# Patient Record
Sex: Male | Born: 1970 | Race: White | Hispanic: No | Smoking: Current every day smoker
Health system: Southern US, Community
[De-identification: ages and names within clinical notes are randomized; demographics above are authoritative.]

## PROBLEM LIST (undated history)

## (undated) DIAGNOSIS — F1490 Cocaine use, unspecified, uncomplicated: Secondary | ICD-10-CM

## (undated) HISTORY — PX: LEG SURGERY: SHX1003

---

## 2002-12-29 ENCOUNTER — Emergency Department (HOSPITAL_COMMUNITY): Admission: EM | Admit: 2002-12-29 | Discharge: 2002-12-29 | Payer: Self-pay | Admitting: Emergency Medicine

## 2002-12-29 ENCOUNTER — Encounter: Payer: Self-pay | Admitting: Emergency Medicine

## 2006-09-04 ENCOUNTER — Emergency Department (HOSPITAL_COMMUNITY): Admission: EM | Admit: 2006-09-04 | Discharge: 2006-09-04 | Payer: Self-pay | Admitting: Emergency Medicine

## 2010-07-19 ENCOUNTER — Emergency Department (HOSPITAL_COMMUNITY)
Admission: EM | Admit: 2010-07-19 | Discharge: 2010-07-19 | Payer: Self-pay | Source: Home / Self Care | Admitting: Emergency Medicine

## 2013-03-15 ENCOUNTER — Emergency Department (HOSPITAL_COMMUNITY)
Admission: EM | Admit: 2013-03-15 | Discharge: 2013-03-16 | Disposition: A | Discharge status: Home / Self Care | Payer: Medicaid Other | Attending: Emergency Medicine | Admitting: Emergency Medicine

## 2013-03-15 ENCOUNTER — Encounter (HOSPITAL_COMMUNITY): Payer: Self-pay | Admitting: *Deleted

## 2013-03-15 DIAGNOSIS — F1994 Other psychoactive substance use, unspecified with psychoactive substance-induced mood disorder: Secondary | ICD-10-CM

## 2013-03-15 DIAGNOSIS — F17200 Nicotine dependence, unspecified, uncomplicated: Secondary | ICD-10-CM | POA: Insufficient documentation

## 2013-03-15 DIAGNOSIS — R61 Generalized hyperhidrosis: Secondary | ICD-10-CM | POA: Insufficient documentation

## 2013-03-15 DIAGNOSIS — R450 Nervousness: Secondary | ICD-10-CM | POA: Insufficient documentation

## 2013-03-15 DIAGNOSIS — F14129 Cocaine abuse with intoxication, unspecified: Secondary | ICD-10-CM

## 2013-03-15 DIAGNOSIS — R45851 Suicidal ideations: Secondary | ICD-10-CM

## 2013-03-15 DIAGNOSIS — G479 Sleep disorder, unspecified: Secondary | ICD-10-CM | POA: Insufficient documentation

## 2013-03-15 DIAGNOSIS — F1910 Other psychoactive substance abuse, uncomplicated: Secondary | ICD-10-CM

## 2013-03-15 DIAGNOSIS — F1410 Cocaine abuse, uncomplicated: Secondary | ICD-10-CM | POA: Insufficient documentation

## 2013-03-15 DIAGNOSIS — F411 Generalized anxiety disorder: Secondary | ICD-10-CM | POA: Insufficient documentation

## 2013-03-15 HISTORY — DX: Cocaine use, unspecified, uncomplicated: F14.90

## 2013-03-15 LAB — CBC
HCT: 39.8 % (ref 39.0–52.0)
Hemoglobin: 14.1 g/dL (ref 13.0–17.0)
MCH: 30.5 pg (ref 26.0–34.0)
MCHC: 35.4 g/dL (ref 30.0–36.0)
MCV: 86.1 fL (ref 78.0–100.0)
Platelets: 310 10*3/uL (ref 150–400)
RBC: 4.62 MIL/uL (ref 4.22–5.81)
RDW: 13 % (ref 11.5–15.5)
WBC: 5.7 10*3/uL (ref 4.0–10.5)

## 2013-03-15 LAB — URINALYSIS, ROUTINE W REFLEX MICROSCOPIC
Glucose, UA: NEGATIVE mg/dL
Hgb urine dipstick: NEGATIVE
Ketones, ur: 15 mg/dL — AB
Leukocytes, UA: NEGATIVE
Nitrite: NEGATIVE
Protein, ur: 30 mg/dL — AB
Specific Gravity, Urine: 1.039 — ABNORMAL HIGH (ref 1.005–1.030)
Urobilinogen, UA: 1 mg/dL (ref 0.0–1.0)
pH: 5.5 (ref 5.0–8.0)

## 2013-03-15 LAB — SALICYLATE LEVEL: Salicylate Lvl: <2 mg/dL — ABNORMAL LOW (ref 2.8–20.0)

## 2013-03-15 LAB — COMPREHENSIVE METABOLIC PANEL
ALT: 16 U/L (ref 0–53)
AST: 22 U/L (ref 0–37)
Albumin: 4.1 g/dL (ref 3.5–5.2)
Alkaline Phosphatase: 64 U/L (ref 39–117)
BUN: 10 mg/dL (ref 6–23)
CO2: 29 mEq/L (ref 19–32)
Calcium: 9.3 mg/dL (ref 8.4–10.5)
Chloride: 94 mEq/L — ABNORMAL LOW (ref 96–112)
Creatinine, Ser: 1.1 mg/dL (ref 0.50–1.35)
GFR calc Af Amer: >90 mL/min (ref >90)
GFR calc non Af Amer: 82 mL/min — ABNORMAL LOW (ref >90)
Glucose, Bld: 96 mg/dL (ref 70–99)
Potassium: 3.1 mEq/L — ABNORMAL LOW (ref 3.5–5.1)
Sodium: 134 mEq/L — ABNORMAL LOW (ref 135–145)
Total Bilirubin: 0.4 mg/dL (ref 0.3–1.2)
Total Protein: 7.4 g/dL (ref 6.0–8.3)

## 2013-03-15 LAB — URINE MICROSCOPIC-ADD ON

## 2013-03-15 LAB — ETHANOL: Alcohol, Ethyl (B): <11 mg/dL (ref 0–11)

## 2013-03-15 LAB — RAPID URINE DRUG SCREEN, HOSP PERFORMED
Amphetamines: NOT DETECTED
Barbiturates: NOT DETECTED
Benzodiazepines: NOT DETECTED
Cocaine: POSITIVE — AB
Opiates: NOT DETECTED
Tetrahydrocannabinol: NOT DETECTED

## 2013-03-15 LAB — ACETAMINOPHEN LEVEL: Acetaminophen (Tylenol), Serum: <15 ug/mL (ref 10–30)

## 2013-03-15 MED ORDER — ACETAMINOPHEN 325 MG PO TABS
650.0000 mg | ORAL_TABLET | ORAL | Status: DC | PRN
Start: 2013-03-15 — End: 2013-03-16
  Administered 2013-03-16: 650 mg via ORAL
  Filled 2013-03-15: qty 2

## 2013-03-15 MED ORDER — ONDANSETRON HCL 4 MG PO TABS: 4.0000 mg | ORAL_TABLET | Freq: Three times a day (TID) | ORAL | Status: DC | PRN

## 2013-03-15 MED ORDER — IBUPROFEN 600 MG PO TABS: 600.0000 mg | ORAL_TABLET | Freq: Three times a day (TID) | ORAL | Status: DC | PRN

## 2013-03-15 MED ORDER — NICOTINE 21 MG/24HR TD PT24: 21.0000 mg | MEDICATED_PATCH | Freq: Every day | TRANSDERMAL | Status: DC

## 2013-03-15 MED ORDER — SODIUM CHLORIDE 0.9 % IV BOLUS (SEPSIS): 500.0000 mL | Freq: Once | INTRAVENOUS | Status: DC

## 2013-03-15 MED ORDER — ZOLPIDEM TARTRATE 5 MG PO TABS: 5.0000 mg | ORAL_TABLET | Freq: Every evening | ORAL | Status: DC | PRN

## 2013-03-15 MED ORDER — POTASSIUM CHLORIDE CRYS ER 20 MEQ PO TBCR
40.0000 meq | EXTENDED_RELEASE_TABLET | Freq: Once | ORAL | Status: AC
  Administered 2013-03-15: 40 meq via ORAL
  Filled 2013-03-15: qty 2

## 2013-03-15 MED ORDER — LORAZEPAM 1 MG PO TABS
1.0000 mg | ORAL_TABLET | Freq: Three times a day (TID) | ORAL | Status: DC | PRN
  Administered 2013-03-16: 1 mg via ORAL
  Filled 2013-03-15: qty 1

## 2013-03-15 MED ORDER — SODIUM CHLORIDE 0.9 % IV SOLN: Freq: Once | INTRAVENOUS | Status: DC

## 2013-03-15 NOTE — ED Notes (Signed)
ZOX:[id number]<AV> Expected date:<BR> Expected time:<BR> Means of arrival:<BR> Comments:<BR> T3

## 2013-03-15 NOTE — ED Notes (Signed)
Pt has 3 personal belongings bags.

## 2013-03-15 NOTE — ED Provider Notes (Signed)
History     CSN: [id number]  Arrival date & time 03/15/13  1949   First MD Initiated Contact with Patient 03/15/13 2142      Chief Complaint  Patient presents with  . Medical Clearance    (Consider location/radiation/quality/duration/timing/severity/associated sxs/prior treatment) HPI  Patient is a 42 yo M PMHx significant for substance abuse presenting for SI and requesting detox. Pt states his last use was this evening and denies other RD use or ETOH use. Pt states he is having suicidal ideations, but denies a plan. He has attempted to take his own life in the past. He denies HI, auditory/visual hallucinations. Denies any physical complaints. Patient denies fevers, chills, nausea, vomiting, or diarrhea.    Past Medical History  Diagnosis Date  . Crack cocaine use     Past Surgical History  Procedure Laterality Date  . Leg surgery      History reviewed. No pertinent family history.  History  Substance Use Topics  . Smoking status: Current Every Day Smoker    Types: Cigarettes  . Smokeless tobacco: Not on file  . Alcohol Use: No      Review of Systems  Constitutional: Positive for diaphoresis. Negative for fever and chills.  HENT: Negative.   Eyes: Negative.   Respiratory: Negative.   Cardiovascular: Negative.   Gastrointestinal: Negative.   Genitourinary: Negative.   Musculoskeletal: Negative.   Skin: Negative.   Neurological: Negative.   Psychiatric/Behavioral: Positive for suicidal ideas. The patient is nervous/anxious.     Allergies  Review of patient's allergies indicates no known allergies.  Home Medications  No current outpatient prescriptions on file.  BP 128/79  Pulse 60  Temp(Src) 98.3 F (36.8 C) (Oral)  Resp 18  SpO2 98%  Physical Exam  Constitutional: He is oriented to person, place, and time. He appears well-developed and well-nourished.  HENT:  Head: Normocephalic and atraumatic.  Eyes: EOM are normal. Pupils are equal, round, and  reactive to light.  Neck: Neck supple.  Cardiovascular: Normal rate, regular rhythm and normal heart sounds.   Pulmonary/Chest: Effort normal and breath sounds normal.  Abdominal: Soft. Bowel sounds are normal. There is no tenderness.  Neurological: He is alert and oriented to person, place, and time.  Skin: Skin is warm and dry. He is not diaphoretic.  Psychiatric: His mood appears anxious. He expresses suicidal ideation. He expresses no homicidal ideation. He expresses no suicidal plans and no homicidal plans.    ED Course  Procedures (including critical care time)  Medications  LORazepam (ATIVAN) tablet 1 mg (not administered)  acetaminophen (TYLENOL) tablet 650 mg (not administered)  ibuprofen (ADVIL,MOTRIN) tablet 600 mg (not administered)  zolpidem (AMBIEN) tablet 5 mg (not administered)  nicotine (NICODERM CQ - dosed in mg/24 hours) patch 21 mg (not administered)  ondansetron (ZOFRAN) tablet 4 mg (not administered)  potassium chloride SA (K-DUR,KLOR-CON) CR tablet 40 mEq (40 mEq Oral Given 03/15/13 2337)   Tolerating PO intake.   Labs Reviewed  COMPREHENSIVE METABOLIC PANEL - Abnormal; Notable for the following:    Sodium 134 (*)    Potassium 3.1 (*)    Chloride 94 (*)    GFR calc non Af Amer 82 (*)    All other components within normal limits  SALICYLATE LEVEL - Abnormal; Notable for the following:    Salicylate Lvl <2.0 (*)    All other components within normal limits  URINE RAPID DRUG SCREEN (HOSP PERFORMED) - Abnormal; Notable for the following:    Cocaine POSITIVE (*)  All other components within normal limits  URINALYSIS, ROUTINE W REFLEX MICROSCOPIC - Abnormal; Notable for the following:    Color, Urine ORANGE (*)    APPearance TURBID (*)    Specific Gravity, Urine 1.039 (*)    Bilirubin Urine MODERATE (*)    Ketones, ur 15 (*)    Protein, ur 30 (*)    All other components within normal limits  URINE MICROSCOPIC-ADD ON - Abnormal; Notable for the following:     Squamous Epithelial / LPF MANY (*)    All other components within normal limits  ACETAMINOPHEN LEVEL  CBC  ETHANOL   No results found.   1. Substance abuse   2. Suicidal ideation       MDM  Pt presents to the ED for medical clearance.  Pt is currently having SI ideations. Pt does not have a plan. The patients demeanor is dysphoric. Admits difficulty sleeping, loss of interests, feelings of worthlessness, lack of energy, difficulty concentrating, loss of appetite, feelings of anxiety. The patient currently does not have any acute physical complaints and is in no acute distress. The patients demeanor is anxious. The patient was brought to ED by self an is seeking assistance for crack cocaine detox, advised we do not have facilities for detox, but he needed to be cleared by Telepsych and Act team for SI. Denies HI, auditory or visual hallucinations. Given PO potassium supplementation for hypokalemia. Tolerating PO intake well. ACT consult was appreciated and pt was moved to Acute ED for further evaluation, as Psych ED full. Patient awaiting consults at time of shift change. Stable at time of shift change.             Jeannetta Ellis, PA-C 03/16/13 1604

## 2013-03-15 NOTE — ED Notes (Signed)
Pt requesting detox from crack. Reports last use was today. Also reports SI. Denies plan.

## 2013-03-16 DIAGNOSIS — F1410 Cocaine abuse, uncomplicated: Secondary | ICD-10-CM

## 2013-03-16 DIAGNOSIS — F14129 Cocaine abuse with intoxication, unspecified: Secondary | ICD-10-CM

## 2013-03-16 DIAGNOSIS — F1994 Other psychoactive substance use, unspecified with psychoactive substance-induced mood disorder: Secondary | ICD-10-CM | POA: Diagnosis present

## 2013-03-16 NOTE — BH Assessment (Signed)
Assessment Note   Clayton Howell is an 42 y.o. male requesting crack cocaine detox. Patient reports using $100 worth of cocaine each day for the past month. Sts that he has been binging. He last used 03/15/13 approximately $100 worth of cocaine. Patient also drinking a small amount of alcohol (1) 40oz beer each day. Patient's last drink was 03/15/2013 and he drank 1 swallow of beer.   Per ED notes, patient reported suicidal ideations with no plan upon his arrival to the ED. Writer assessed patient and he declined suicidal ideations. He admits to yrs of fleeing suicidal thoughts on/off. Patient denies depression and anxiety. Sts, "I just have problems sleeping". He however reported to ED staff upon arrival that he was having increased depression with multiple related symptoms. Patient denies having any stressors during the assessment. However, patient did state that he is homeless and doesn't have a support system.   Patient denies HI. He has no history.   Patient denies AVH's.   Patient has not received any mental health treatment in the past. He has however received treatment for substance abuse at [facility] 2005-2007 and 2x's in 2013. Patient has also received treatment at [facility] in 2013 for 13 days.  Axis I: Crack Cocaine Abuse; Depressive Disorder NOS Axis II: Deferred Axis III:  Past Medical History  Diagnosis Date  . Crack cocaine use    Axis IV: economic problems, housing problems, other psychosocial or environmental problems, problems related to social environment, problems with access to health care services and problems with primary support group Axis V: 41-50 serious symptoms  Past Medical History:  Past Medical History  Diagnosis Date  . Crack cocaine use     Past Surgical History  Procedure Laterality Date  . Leg surgery      Family History: History reviewed. No pertinent family history.  Social History:  reports that he has been smoking Cigarettes.  He has been  smoking about 0.00 packs per day. He does not have any smokeless tobacco history on file. He reports that he uses illicit drugs (Cocaine). He reports that he does not drink alcohol.  Additional Social History:  Alcohol / Drug Use Pain Medications: See MAR Prescriptions: See MAR Over the Counter: See MAR History of alcohol / drug use?: Yes Substance #1 Name of Substance 1: Alcohol  1 - Age of First Use: 42 yrs old  1 - Amount (size/oz): (1) 40oz beer every 3-4 days  1 - Frequency: every 3-4 days 1 - Duration: past month 1 - Last Use / Amount: 03/15/2013; "1 swallow" Substance #2 Name of Substance 2: Crack cocaine 2 - Age of First Use: 42 yrs old  2 - Amount (size/oz): $100 worth of cocaine 2 - Frequency: daily  2 - Duration: on-going/daiily for the past month 2 - Last Use / Amount: 03/15/13;$100 worth of cocaine  CIWA: CIWA-Ar BP: 112/69 mmHg Pulse Rate: 54 COWS:    Allergies: No Known Allergies  Home Medications:  (Not in a hospital admission)  OB/GYN Status:  No LMP for male patient.  General Assessment Data Location of Assessment: [facility] Living Arrangements: Other (Comment) (homeless) Can pt return to current living arrangement?: No Admission Status: Voluntary Is patient capable of signing voluntary admission?: Yes Transfer from: Acute Hospital Referral Source: Self/Family/Friend     Risk to self Suicidal Ideation: No Suicidal Intent: No Is patient at risk for suicide?: No Suicidal Plan?: No Access to Means: No What has been your use of drugs/alcohol  within the last 12 months?:  (patient reports alcohol and crack cocaine use) Previous Attempts/Gestures: No How many times?:  (0) Other Self Harm Risks: n/a Triggers for Past Attempts:  (no previous attempts/gestures) Intentional Self Injurious Behavior: None Family Suicide History: No Recent stressful life event(s): Other (Comment) (patient denies all stressors) Persecutory voices/beliefs?: No Depression:  Yes Depression Symptoms: Insomnia;Isolating;Loss of interest in usual pleasures;Feeling worthless/self pity;Feeling angry/irritable Substance abuse history and/or treatment for substance abuse?: No Suicide prevention information given to non-admitted patients: Not applicable  Risk to Others Homicidal Ideation: No Thoughts of Harm to Others: No Current Homicidal Intent: No Current Homicidal Plan: No Access to Homicidal Means: No Identified Victim:  (n/a) History of harm to others?: No Assessment of Violence: None Noted Violent Behavior Description:  (patient is calm and cooperative) Does patient have access to weapons?: No Criminal Charges Pending?: No Does patient have a court date: No  Psychosis Hallucinations: None noted Delusions: None noted  Mental Status Report Appear/Hygiene: Disheveled Eye Contact: Fair Motor Activity: Freedom of movement Speech: Pressured Level of Consciousness: Alert;Restless;Irritable Mood: Depressed;Anxious;Irritable;Preoccupied Affect: Appropriate to circumstance Anxiety Level: Minimal Thought Processes: Circumstantial Judgement: Impaired Orientation: Person;Place;Time;Situation Obsessive Compulsive Thoughts/Behaviors: None  Cognitive Functioning Concentration: Decreased Memory: Recent Intact;Remote Intact IQ: Average Insight: Fair Impulse Control: Fair Appetite: Good Weight Loss:  (none reported) Weight Gain:  (none reported) Sleep: Decreased Total Hours of Sleep:  (patient sleeping no more than 2-3 hours per day) Vegetative Symptoms: None  ADLScreening [facility] Assessment Services) Patient's cognitive ability adequate to safely complete daily activities?: Yes Patient able to express need for assistance with ADLs?: Yes Independently performs ADLs?: Yes (appropriate for developmental age)  Abuse/Neglect [facility]) Physical Abuse: Denies Verbal Abuse: Denies Sexual Abuse: Denies  Prior Inpatient Therapy Prior Inpatient Therapy:  Yes Prior Therapy Dates:  ([facility]-2007 & 2013(2x's),[facility]- 2013 (13 days),) Prior Therapy Facilty/Provider(s):  ([facility] & [facility]) Reason for Treatment:  (substance abuse treatment only)  Prior Outpatient Therapy Prior Outpatient Therapy: No Prior Therapy Dates:  (n/a) Prior Therapy Facilty/Provider(s):  (n/a) Reason for Treatment:  (n/a)  ADL Screening (condition at time of admission) Patient's cognitive ability adequate to safely complete daily activities?: Yes Patient able to express need for assistance with ADLs?: Yes Independently performs ADLs?: Yes (appropriate for developmental age) Weakness of Legs: None Weakness of Arms/Hands: None  Home Assistive Devices/Equipment Home Assistive Devices/Equipment: None    Abuse/Neglect Assessment (Assessment to be complete while patient is alone) Physical Abuse: Denies Verbal Abuse: Denies Sexual Abuse: Denies Exploitation of patient/patient's resources: Denies Self-Neglect: Denies Values / Beliefs Cultural Requests During Hospitalization: None Spiritual Requests During Hospitalization: None   Advance Directives (For Healthcare) Advance Directive: Patient does not have advance directive Nutrition Screen- [facility] Adult/[facility] Patient's home diet: Regular  Additional Information 1:1 In Past 12 Months?: No CIRT Risk: No Elopement Risk: No Does patient have medical clearance?: Yes     Disposition:  Disposition Initial Assessment Completed for this Encounter: Yes Disposition of Patient: Inpatient treatment program Type of inpatient treatment program: Adult  On Site Evaluation by:   Reviewed with Physician:     Melynda Ripple [facility] 03/16/2013 11:08 AM

## 2013-03-16 NOTE — BH Assessment (Signed)
BHH Assessment Progress Note  Patient assessed by ACT. Disposition pending consult with psychiatrist Dr. Elsie Saas.

## 2013-03-16 NOTE — ED Notes (Signed)
Patient seemed agitated and irritable on arrival to this unit. He reported having pain on his tongue and heel. Mood and affect angry and irritable.Writer offered Ativan and tylenol with Gatorade. Provided  comfort and encouragement. Q 15 minute check continues as ordered to maintain safety.
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
## 2013-03-16 NOTE — ED Notes (Signed)
Patient was given information for out patent followup, became upset and angry when told he was being discharged from the hospital to home, aunt also upset that patient was not staying or being admitted to another facility, tried to go over information w/patient butt  patient was too upset to respond effectively. Police escorted patient out of Psych ED.

## 2013-03-16 NOTE — BHH Suicide Risk Assessment (Signed)
Suicide Risk Assessment  Discharge Assessment     Demographic Factors:  Male, Caucasian and Low socioeconomic status  Mental Status Per Nursing Assessment::   On Admission:     Current Mental Status by Physician: NA  Loss Factors: Decrease in vocational status and Financial problems/change in socioeconomic status  Historical Factors: Impulsivity  Risk Reduction Factors:   Sense of responsibility to family, Religious beliefs about death, Living with another person, especially a relative, Positive social support, Positive therapeutic relationship and Positive coping skills or problem solving skills  Continued Clinical Symptoms:  Alcohol/Substance Abuse/Dependencies  Cognitive Features That Contribute To Risk:  Polarized thinking    Suicide Risk:  Minimal: No identifiable suicidal ideation.  Patients presenting with no risk factors but with morbid ruminations; may be classified as minimal risk based on the severity of the depressive symptoms  Discharge Diagnoses:   AXIS I:  Substance Induced Mood Disorder AXIS II:  Deferred AXIS III:   Past Medical History  Diagnosis Date  . Crack cocaine use    AXIS IV:  economic problems, other psychosocial or environmental problems and problems related to social environment AXIS V:  51-60 moderate symptoms  Plan Of Care/Follow-up recommendations:  Activity:  as tolerated Diet:  regular  Is patient on multiple antipsychotic therapies at discharge:  No   Has Patient had three or more failed trials of antipsychotic monotherapy by history:  No  Recommended Plan for Multiple Antipsychotic Therapies: Not applicable  Takako Minckler,JANARDHAHA R. 03/16/2013, 5:06 PM

## 2013-03-16 NOTE — ED Provider Notes (Signed)
Assuming care of patient this morning. Patient in the ED for suicidal ideations and detox request from alcohol abuse. Awaiting evaluation. Workup thus far is negative. Patient had no complains, no concerns from the nursing side. Will continue to monitor.  Filed Vitals:   03/16/13 0451  BP: 112/69  Pulse: 54  Temp: 98.3 F (36.8 C)  Resp: 18     Derwood Kaplan, MD 03/16/13 [phone]

## 2013-03-16 NOTE — BHH Counselor (Signed)
Patient to be discharged home, per recommendations of psychiatry. Writer has provided a list of substance abuse referrals for patient to take home. Patient has several referrals including [facility], [facility], [facility], etc.

## 2013-03-16 NOTE — ED Notes (Signed)
Pt ambulated to Psych ED, room 36, accompanied by NT with chart and 3 bags of personal belongings, condition stable at time of transfer.

## 2013-03-17 NOTE — Progress Notes (Signed)
Late entry for 03/16/13 ED visit: [place] listed [place] coverage[place] resident. Partnership for [facility] [place], sent pt a [phone] letter

## 2013-03-21 NOTE — ED Provider Notes (Signed)
Medical screening examination/treatment/procedure(s) were performed by non-physician practitioner and as supervising physician I was immediately available for consultation/collaboration.  Raeford Razor, MD 03/21/13 [phone]

## 2014-02-08 ENCOUNTER — Encounter (HOSPITAL_COMMUNITY): Payer: Self-pay | Admitting: Emergency Medicine

## 2014-02-08 ENCOUNTER — Inpatient Hospital Stay (HOSPITAL_COMMUNITY)
Admission: AD | Admit: 2014-02-08 | Discharge: 2014-02-14 | DRG: 897 | Disposition: A | Discharge status: Other Acute Inpatient Hospital | Payer: Medicaid Other | Source: Intra-hospital | Attending: Psychiatry | Admitting: Psychiatry

## 2014-02-08 ENCOUNTER — Emergency Department (EMERGENCY_DEPARTMENT_HOSPITAL)
Admission: EM | Admit: 2014-02-08 | Discharge: 2014-02-08 | Disposition: A | Discharge status: Other Acute Inpatient Hospital | Payer: Medicaid Other | Source: Home / Self Care | Attending: Emergency Medicine | Admitting: Emergency Medicine

## 2014-02-08 ENCOUNTER — Encounter (HOSPITAL_COMMUNITY): Payer: Self-pay | Admitting: Behavioral Health

## 2014-02-08 DIAGNOSIS — G4700 Insomnia, unspecified: Secondary | ICD-10-CM | POA: Diagnosis present

## 2014-02-08 DIAGNOSIS — R45851 Suicidal ideations: Secondary | ICD-10-CM

## 2014-02-08 DIAGNOSIS — F1210 Cannabis abuse, uncomplicated: Secondary | ICD-10-CM | POA: Diagnosis present

## 2014-02-08 DIAGNOSIS — F1020 Alcohol dependence, uncomplicated: Principal | ICD-10-CM | POA: Diagnosis present

## 2014-02-08 DIAGNOSIS — F1994 Other psychoactive substance use, unspecified with psychoactive substance-induced mood disorder: Secondary | ICD-10-CM | POA: Diagnosis present

## 2014-02-08 DIAGNOSIS — F1420 Cocaine dependence, uncomplicated: Secondary | ICD-10-CM | POA: Diagnosis present

## 2014-02-08 DIAGNOSIS — F17200 Nicotine dependence, unspecified, uncomplicated: Secondary | ICD-10-CM | POA: Insufficient documentation

## 2014-02-08 DIAGNOSIS — F411 Generalized anxiety disorder: Secondary | ICD-10-CM | POA: Diagnosis present

## 2014-02-08 DIAGNOSIS — F341 Dysthymic disorder: Secondary | ICD-10-CM | POA: Insufficient documentation

## 2014-02-08 DIAGNOSIS — F32A Depression, unspecified: Secondary | ICD-10-CM

## 2014-02-08 DIAGNOSIS — F3289 Other specified depressive episodes: Secondary | ICD-10-CM

## 2014-02-08 DIAGNOSIS — F1410 Cocaine abuse, uncomplicated: Secondary | ICD-10-CM

## 2014-02-08 DIAGNOSIS — F329 Major depressive disorder, single episode, unspecified: Secondary | ICD-10-CM

## 2014-02-08 DIAGNOSIS — G479 Sleep disorder, unspecified: Secondary | ICD-10-CM

## 2014-02-08 DIAGNOSIS — F1010 Alcohol abuse, uncomplicated: Secondary | ICD-10-CM | POA: Diagnosis present

## 2014-02-08 DIAGNOSIS — F321 Major depressive disorder, single episode, moderate: Secondary | ICD-10-CM | POA: Diagnosis present

## 2014-02-08 LAB — RAPID URINE DRUG SCREEN, HOSP PERFORMED
AMPHETAMINES: NOT DETECTED
Barbiturates: NOT DETECTED
Benzodiazepines: NOT DETECTED
Cocaine: POSITIVE — AB
OPIATES: NOT DETECTED
Tetrahydrocannabinol: POSITIVE — AB

## 2014-02-08 LAB — COMPREHENSIVE METABOLIC PANEL
ALBUMIN: 4.5 g/dL (ref 3.5–5.2)
ALT: 9 U/L (ref 0–53)
AST: 16 U/L (ref 0–37)
Alkaline Phosphatase: 82 U/L (ref 39–117)
BUN: 14 mg/dL (ref 6–23)
CALCIUM: 10 mg/dL (ref 8.4–10.5)
CHLORIDE: 97 meq/L (ref 96–112)
CO2: 26 mEq/L (ref 19–32)
CREATININE: 1.07 mg/dL (ref 0.50–1.35)
GFR calc Af Amer: >90 mL/min (ref >90)
GFR calc non Af Amer: 84 mL/min — ABNORMAL LOW (ref >90)
Glucose, Bld: 106 mg/dL — ABNORMAL HIGH (ref 70–99)
Potassium: 3.8 mEq/L (ref 3.7–5.3)
Sodium: 136 mEq/L — ABNORMAL LOW (ref 137–147)
TOTAL PROTEIN: 8.1 g/dL (ref 6.0–8.3)
Total Bilirubin: 0.6 mg/dL (ref 0.3–1.2)

## 2014-02-08 LAB — CBC
HCT: 49 % (ref 39.0–52.0)
Hemoglobin: 16.8 g/dL (ref 13.0–17.0)
MCH: 31.3 pg (ref 26.0–34.0)
MCHC: 34.3 g/dL (ref 30.0–36.0)
MCV: 91.2 fL (ref 78.0–100.0)
PLATELETS: 363 10*3/uL (ref 150–400)
RBC: 5.37 MIL/uL (ref 4.22–5.81)
RDW: 13.1 % (ref 11.5–15.5)
WBC: 10.7 10*3/uL — AB (ref 4.0–10.5)

## 2014-02-08 LAB — ETHANOL: ALCOHOL ETHYL (B): 99 mg/dL — AB (ref 0–11)

## 2014-02-08 LAB — ACETAMINOPHEN LEVEL: Acetaminophen (Tylenol), Serum: <15 ug/mL (ref 10–30)

## 2014-02-08 LAB — SALICYLATE LEVEL

## 2014-02-08 MED ORDER — THIAMINE HCL 100 MG/ML IJ SOLN: 100.0000 mg | Freq: Once | INTRAMUSCULAR | Status: DC

## 2014-02-08 MED ORDER — ONDANSETRON 4 MG PO TBDP: 4.0000 mg | ORAL_TABLET | Freq: Four times a day (QID) | ORAL | Status: AC | PRN

## 2014-02-08 MED ORDER — CHLORDIAZEPOXIDE HCL 25 MG PO CAPS
25.0000 mg | ORAL_CAPSULE | Freq: Every day | ORAL | Status: AC
  Administered 2014-02-13: 25 mg via ORAL
  Filled 2014-02-08: qty 1

## 2014-02-08 MED ORDER — VITAMIN B-1 100 MG PO TABS
100.0000 mg | ORAL_TABLET | Freq: Every day | ORAL | Status: DC
  Administered 2014-02-09 – 2014-02-13 (×5): 100 mg via ORAL
  Filled 2014-02-08 (×7): qty 1

## 2014-02-08 MED ORDER — FLUOXETINE HCL 20 MG PO CAPS
20.0000 mg | ORAL_CAPSULE | Freq: Every day | ORAL | Status: DC
  Administered 2014-02-08 – 2014-02-13 (×6): 20 mg via ORAL
  Filled 2014-02-08 (×4): qty 1
  Filled 2014-02-08: qty 14
  Filled 2014-02-08 (×4): qty 1

## 2014-02-08 MED ORDER — CHLORDIAZEPOXIDE HCL 25 MG PO CAPS
25.0000 mg | ORAL_CAPSULE | Freq: Three times a day (TID) | ORAL | Status: AC
  Administered 2014-02-10 – 2014-02-11 (×3): 25 mg via ORAL
  Filled 2014-02-08 (×3): qty 1

## 2014-02-08 MED ORDER — HYDROXYZINE HCL 25 MG PO TABS
25.0000 mg | ORAL_TABLET | Freq: Four times a day (QID) | ORAL | Status: DC | PRN
  Administered 2014-02-08 – 2014-02-13 (×5): 25 mg via ORAL
  Filled 2014-02-08 (×4): qty 1

## 2014-02-08 MED ORDER — ALUM & MAG HYDROXIDE-SIMETH 200-200-20 MG/5ML PO SUSP: 30.0000 mL | ORAL | Status: DC | PRN

## 2014-02-08 MED ORDER — LOPERAMIDE HCL 2 MG PO CAPS: 2.0000 mg | ORAL_CAPSULE | ORAL | Status: AC | PRN

## 2014-02-08 MED ORDER — ONDANSETRON HCL 4 MG PO TABS: 4.0000 mg | ORAL_TABLET | Freq: Three times a day (TID) | ORAL | Status: DC | PRN

## 2014-02-08 MED ORDER — CHLORDIAZEPOXIDE HCL 25 MG PO CAPS
25.0000 mg | ORAL_CAPSULE | Freq: Four times a day (QID) | ORAL | Status: AC
  Administered 2014-02-09 – 2014-02-10 (×5): 25 mg via ORAL
  Filled 2014-02-08 (×4): qty 1

## 2014-02-08 MED ORDER — IBUPROFEN 200 MG PO TABS: 600.0000 mg | ORAL_TABLET | Freq: Three times a day (TID) | ORAL | Status: DC | PRN

## 2014-02-08 MED ORDER — ACETAMINOPHEN 325 MG PO TABS: 650.0000 mg | ORAL_TABLET | ORAL | Status: DC | PRN

## 2014-02-08 MED ORDER — LORAZEPAM 1 MG PO TABS: 1.0000 mg | ORAL_TABLET | Freq: Three times a day (TID) | ORAL | Status: DC | PRN

## 2014-02-08 MED ORDER — CHLORDIAZEPOXIDE HCL 25 MG PO CAPS
25.0000 mg | ORAL_CAPSULE | Freq: Once | ORAL | Status: DC
  Filled 2014-02-08: qty 1

## 2014-02-08 MED ORDER — ADULT MULTIVITAMIN W/MINERALS CH
1.0000 | ORAL_TABLET | Freq: Every day | ORAL | Status: DC
  Administered 2014-02-08 – 2014-02-13 (×6): 1 via ORAL
  Filled 2014-02-08 (×9): qty 1

## 2014-02-08 MED ORDER — MAGNESIUM HYDROXIDE 400 MG/5ML PO SUSP
30.0000 mL | Freq: Every day | ORAL | Status: DC | PRN
  Administered 2014-02-10: 30 mL via ORAL

## 2014-02-08 MED ORDER — ACETAMINOPHEN 325 MG PO TABS
650.0000 mg | ORAL_TABLET | ORAL | Status: DC | PRN
  Administered 2014-02-10: 650 mg via ORAL
  Filled 2014-02-08: qty 2

## 2014-02-08 MED ORDER — CHLORDIAZEPOXIDE HCL 25 MG PO CAPS
25.0000 mg | ORAL_CAPSULE | ORAL | Status: AC
  Administered 2014-02-11 – 2014-02-12 (×2): 25 mg via ORAL
  Filled 2014-02-08 (×2): qty 1

## 2014-02-08 MED ORDER — CHLORDIAZEPOXIDE HCL 25 MG PO CAPS: 25.0000 mg | ORAL_CAPSULE | Freq: Four times a day (QID) | ORAL | Status: AC | PRN

## 2014-02-08 NOTE — Progress Notes (Signed)
Nursing Admission Note: 43 y/o male who present voluntarily for SI with no plan, substance abuse (crack cocaine, etoh, marijuana) and depression.  Patient states he has lost everything, his job, house and his family.  Patient states he has lost the trust of his family due to his drug use.  Patient states he is SI but states he does not have a plan.  Patient states he would not kill himself but states he would just like for his life to end.  Patient tearful during admission.  Patient states he feel guilty for what he has done to his family.  Patient states he uses crack cocaine daily (as much as his money will buy).  Patient states he used etoh approximately once or twice a week and states he uses cocaine occasionally.  Patient currently states he is passive SI but contracts for safety.  Patient denies HI but states he is unsure if he hears voices but states when he is using drugs he thinks he does.  Patient states he was staying in a crack house states was infested with bed bugs.  Patient has scabbed areas to bilateral arms, bilateral ankles and chest.  Per ed notes MD stated it was not bed bug bites but r/t drug use.  Patient states he does scratch and pick at the scabs at times.  Patient denies medical history other than substance abuse.  Patient belongings double bagged and put behind partition due to potential bed bug exposure.  Patient informed Clinical research associatewriter that he wanted Clinical research associatewriter to call his aunt Alvino Chapelllen to inform her that he was here and he wanted her to have his code number.  Consents obtained, fall safety plan explained and patient verbalized understanding.  Patient escorted and oriented to the unit.  Patient offered no additional questions or concerns.

## 2014-02-08 NOTE — Progress Notes (Signed)
Writer obtained clinical information from the ER MD Dr.Opitz.  Per, Dr. Dierdre Highmanpitz reports that the patient requests detox from crack cocaine.   Per Melissa,at [facility] they do not have any availability.  Per, Cory MunchAdolph at [facility] they do have detox beds.    Writer will assess the patient at 5am

## 2014-02-08 NOTE — Consult Note (Signed)
  Mr Clayton Howell is vaguely suicidal, saying he is tired of life and has given up.  He cannot contract for safety.  He wants help stopping use of alcohol, crack and THC he says as he cannot stop on his own.  Crack is the main thing he wants help with.  Does not drink every day.  [facility] treatment of substance abuse and depression.  He will not bring any of his affected belongings with him due to his bed bug infestation.

## 2014-02-08 NOTE — Progress Notes (Signed)
Per, Dr. Ladona Ridgelaylor the patient meets criteria for inpatient hospitalization.  [facility]  [facility]

## 2014-02-08 NOTE — Tx Team (Signed)
Initial Interdisciplinary Treatment Plan  PATIENT STRENGTHS: (choose at least two) Ability for insight Communication skills Motivation for treatment/growth Supportive family/friends  PATIENT STRESSORS: Financial difficulties Marital or family conflict Occupational concerns Substance abuse   PROBLEM LIST: Problem List/Patient Goals Date to be addressed Date deferred Reason deferred Estimated date of resolution  (Substance Abuse crack cocaine, etoh, marijuana 02/08/2014     Suicidal ideations 02/08/2014     depression 02/08/2014     Homelessness 02/08/2014                                    DISCHARGE CRITERIA:  Ability to meet basic life and health needs Adequate post-discharge living arrangements Improved stabilization in mood, thinking, and/or behavior Need for constant or close observation no longer present Safe-care adequate arrangements made Withdrawal symptoms are absent or subacute and managed without 24-hour nursing intervention  PRELIMINARY DISCHARGE PLAN: Attend aftercare/continuing care group Attend PHP/IOP Outpatient therapy Placement in alternative living arrangements  PATIENT/FAMIILY INVOLVEMENT: This treatment plan has been presented to and reviewed with the patient, Clayton Howell.  The patient and family have been given the opportunity to ask questions and make suggestions.  Angeline SlimHill, Clayton Howell 02/08/2014, 8:09 PM

## 2014-02-08 NOTE — ED Provider Notes (Signed)
CSN: [id number]     Arrival date & time 02/08/14  0319 History   First MD Initiated Contact with Patient 02/08/14 0344     Chief Complaint  Patient presents with  . Medical Clearance     (Consider location/radiation/quality/duration/timing/severity/associated sxs/prior Treatment) HPI History provided by patient. States he has been on a crack binge, has not been home in sometime, and now is worried that he is homeless again.  He abuses alcohol and marijuana and is requesting help with detox. He states he is at the end of his rope. He denies any suicidal plan but admits to depression and admits that he does not wish to live anymore. He has been through detox in the past. He has been sober for 8 years. He denies any other ingestions or self injury. He has been picking at his skin for days now with his crack binge. No fevers or chills. No known sick contacts.   Past Medical History  Diagnosis Date  . Crack cocaine use    Past Surgical History  Procedure Laterality Date  . Leg surgery     History reviewed. No pertinent family history. History  Substance Use Topics  . Smoking status: Current Every Day Smoker    Types: Cigarettes  . Smokeless tobacco: Not on file  . Alcohol Use: No    Review of Systems  Constitutional: Negative for fever and chills.  Eyes: Negative for visual disturbance.  Respiratory: Negative for shortness of breath.   Cardiovascular: Negative for chest pain.  Gastrointestinal: Negative for abdominal pain.  Genitourinary: Negative for dysuria.  Musculoskeletal: Negative for back pain.  Skin: Negative for rash.  Neurological: Negative for headaches.  Psychiatric/Behavioral: Positive for suicidal ideas and sleep disturbance.  All other systems reviewed and are negative.      Allergies  Review of patient's allergies indicates no known allergies.  Home Medications  No current outpatient prescriptions on file. BP 116/74  Pulse 97  Temp(Src) 97.6 F (36.4  C) (Oral)  Resp 16  SpO2 100% Physical Exam  Constitutional: He is oriented to person, place, and time. He appears well-developed and well-nourished.  HENT:  Head: Normocephalic and atraumatic.  Eyes: EOM are normal. Pupils are equal, round, and reactive to light.  Neck: Neck supple.  Cardiovascular: Regular rhythm and intact distal pulses.   Pulmonary/Chest: Effort normal. No respiratory distress.  Musculoskeletal: Normal range of motion. He exhibits no edema.  Neurological: He is alert and oriented to person, place, and time.  Skin: Skin is warm and dry.  Multiple areas of superficial wounds to upper extremities c/w formication  Psychiatric:  Tearful, active skin picking    ED Course  Procedures (including critical care time) Labs Review Results for orders placed during the hospital encounter of 02/08/14  ACETAMINOPHEN LEVEL      Result Value Ref Range   Acetaminophen (Tylenol), Serum <15.0  10 - 30 ug/mL  CBC      Result Value Ref Range   WBC 10.7 (*) 4.0 - 10.5 K/uL   RBC 5.37  4.22 - 5.81 MIL/uL   Hemoglobin 16.8  13.0 - 17.0 g/dL   HCT 62.149.0  30.839.0 - 65.752.0 %   MCV 91.2  78.0 - 100.0 fL   MCH 31.3  26.0 - 34.0 pg   MCHC 34.3  30.0 - 36.0 g/dL   RDW 84.613.1  96.211.5 - 95.215.5 %   Platelets 363  150 - 400 K/uL  COMPREHENSIVE METABOLIC PANEL      Result  Value Ref Range   Sodium 136 (*) 137 - 147 mEq/L   Potassium 3.8  3.7 - 5.3 mEq/L   Chloride 97  96 - 112 mEq/L   CO2 26  19 - 32 mEq/L   Glucose, Bld 106 (*) 70 - 99 mg/dL   BUN 14  6 - 23 mg/dL   Creatinine, Ser 1.61  0.50 - 1.35 mg/dL   Calcium 09.6  8.4 - 04.5 mg/dL   Total Protein 8.1  6.0 - 8.3 g/dL   Albumin 4.5  3.5 - 5.2 g/dL   AST 16  0 - 37 U/L   ALT 9  0 - 53 U/L   Alkaline Phosphatase 82  39 - 117 U/L   Total Bilirubin 0.6  0.3 - 1.2 mg/dL   GFR calc non Af Amer 84 (*) >90 mL/min   GFR calc Af Amer >90  >90 mL/min  ETHANOL      Result Value Ref Range   Alcohol, Ethyl (B) 99 (*) 0 - 11 mg/dL  SALICYLATE  LEVEL      Result Value Ref Range   Salicylate Lvl <2.0 (*) 2.8 - 20.0 mg/dL  URINE RAPID DRUG SCREEN (HOSP PERFORMED)      Result Value Ref Range   Opiates NONE DETECTED  NONE DETECTED   Cocaine POSITIVE (*) NONE DETECTED   Benzodiazepines NONE DETECTED  NONE DETECTED   Amphetamines NONE DETECTED  NONE DETECTED   Tetrahydrocannabinol POSITIVE (*) NONE DETECTED   Barbiturates NONE DETECTED  NONE DETECTED   TTS consulted/ Psych dispo pending.    MDM   Dx: Polysubstance Abuse, Depression  Presents requesting detox from crack, marijuana and alcohol. Multiple skin lesions consistent with formication / crank sores.    Labs/ UDS as above Psych consult  VS and nurses notes reviewed   Sunnie Nielsen, MD 02/08/14 [phone]

## 2014-02-08 NOTE — ED Notes (Signed)
Per Dr Dierdre Highmanpitz pt does not have bed bugs but has sores related to his crack use.

## 2014-02-08 NOTE — ED Notes (Signed)
Pt transferred to psych hold for evaluation. Pt arrived to the ED alone requesting help for depression and drug use. Pt states he does not wish to live anymore but "can't die." Denies plan but unable to contract for safety outside hospital.  Pt states he has been binging on crack, marijuana and alcohol for the last couple of weeks and would like help stopping. Crack cocaine and marijuana daily. Alcohol 40 oz today but denies daily use. Pt also states he is infested with bedbugs due to the place he was staying had an infestation problem. Pt has petechiae over his body. Pt depressed and tearful states feels alone and about to lose his home which he had to vacate and lost his job this February. He was raising his 2 teenagers until 12/14, 1 suffers from depression and is a cutter. when he relapsed, they left and went to stay with aunt. Pt reports clean for many years until his mother died 2 years ago since then he has been on and off. [facility]. Will monitor closely and maintain safe environment.

## 2014-02-08 NOTE — BH Assessment (Signed)
Assessment Note   Patient is a 43 year old [place] male that has been on a crack binge for the past several weeks.  Patient is requesting detox.  Patient reports that he has been abusing alcohol, crack/cocaine and marijuana. Patient reports prior detox at [facility] last year.  Patient reports that his longest period of sobriety has been for 8 years.    Patient reports that he has not been able to remember how much he has been using.  Patient reports that he used last night.  Patient reports that he uses as much as he can of everything.  Patient reports that he is not able to stop using.   Patient reports he is at the end of his rope. He denies any suicidal plan but admits to depression and admits that he does not wish to live anymore.   Patient reports that he has been picking at his skin for days now due to his crack binge.  Patient denies prior physical, sexual or emotional abuse in the past.    Axis I: Polysubstance Abuse  and Major Depressive Disorder  Axis II: Deferred Axis III:  Past Medical History  Diagnosis Date  . Crack cocaine use    Axis IV: economic problems, housing problems, occupational problems, other psychosocial or environmental problems, problems related to social environment, problems with access to health care services and problems with primary support group Axis V: 31-40 impairment in reality testing  Past Medical History:  Past Medical History  Diagnosis Date  . Crack cocaine use     Past Surgical History  Procedure Laterality Date  . Leg surgery      Family History: History reviewed. No pertinent family history.  Social History:  reports that he has been smoking Cigarettes.  He has been smoking about 0.00 packs per day. He does not have any smokeless tobacco history on file. He reports that he uses illicit drugs (Cocaine). He reports that he does not drink alcohol.  Additional Social History:     CIWA: CIWA-Ar BP: 120/79 mmHg Pulse Rate: 80 COWS:     Allergies: No Known Allergies  Home Medications:  (Not in a hospital admission)  OB/GYN Status:  No LMP for male patient.  General Assessment Data Location of Assessment: [facility] ED Is this a Tele or Face-to-Face Assessment?: Face-to-Face Is this an Initial Assessment or a Re-assessment for this encounter?: Initial Assessment Living Arrangements: Other (Comment) Can pt return to current living arrangement?: Yes Admission Status: Voluntary Is patient capable of signing voluntary admission?: Yes Transfer from: Acute Hospital Referral Source: Self/Family/Friend  Medical Screening Exam [facility] Walk[facility] ONLY) Medical Exam completed: Yes  [facility] Living Arrangements: Other (Comment) Name of Psychiatrist: NA Name of Therapist: NA  Education Status Is patient currently in school?: No Current Grade: NA Highest grade of school patient has completed: NA Name of school: NA Contact person: NA  Risk to self Suicidal Ideation: Yes-Currently Present Suicidal Intent: Yes-Currently Present Is patient at risk for suicide?: Yes Suicidal Plan?: No Access to Means: No What has been your use of drugs/alcohol within the last 12 months?: Crack Cocaine Previous Attempts/Gestures: No How many times?: 0 Other Self Harm Risks: NA Triggers for Past Attempts: Unpredictable Intentional Self Injurious Behavior:  (sCRATCHING HIS SKIN.) Family Suicide History: No Recent stressful life event(s): Job Loss;Financial Problems;Conflict (Comment) Persecutory voices/beliefs?: No Depression: Yes Depression Symptoms: Despondent;Tearfulness;Fatigue;Guilt;Loss of interest in usual pleasures;Feeling worthless/self pity Substance abuse history and/or treatment for substance abuse?: Yes Suicide prevention  information given to non-admitted patients: Yes  Risk to Others Homicidal Ideation: No Thoughts of Harm to Others: No Current Homicidal Intent: No Current Homicidal Plan: No Access to Homicidal  Means: No Identified Victim: NA History of harm to others?: No Assessment of Violence: None Noted Violent Behavior Description: None Reorted Does patient have access to weapons?: No Criminal Charges Pending?: No Does patient have a court date: No  Psychosis Hallucinations: None noted Delusions: None noted  Mental Status Report Appear/Hygiene: Disheveled Eye Contact: Fair Motor Activity: Unremarkable Speech: Logical/coherent Level of Consciousness: Quiet/awake;Alert Mood: Depressed;Anxious Affect: Appropriate to circumstance Anxiety Level: Minimal Thought Processes: Coherent;Relevant Judgement: Unimpaired Orientation: Person;Place;Time;Situation Obsessive Compulsive Thoughts/Behaviors: None  Cognitive Functioning Concentration: Normal Memory: Recent Intact;Remote Intact IQ: Average Insight: Fair Impulse Control: Poor Appetite: Fair Weight Loss: 0 Weight Gain: 0 Sleep: No Change Total Hours of Sleep: 4 Vegetative Symptoms: Decreased grooming;Not bathing  ADLScreening [facility] Assessment Services) Patient's cognitive ability adequate to safely complete daily activities?: Yes Patient able to express need for assistance with ADLs?: Yes Independently performs ADLs?: Yes (appropriate for developmental age)  Prior Inpatient Therapy Prior Inpatient Therapy: Yes Prior Therapy Dates: 2014 Prior Therapy Facilty/Provider(s): [facility] Reason for Treatment: Substance Abuse  Prior Outpatient Therapy Prior Outpatient Therapy: No Prior Therapy Dates: NA Prior Therapy Facilty/Provider(s): NA Reason for Treatment: NA  ADL Screening (condition at time of admission) Patient's cognitive ability adequate to safely complete daily activities?: Yes Patient able to express need for assistance with ADLs?: Yes Independently performs ADLs?: Yes (appropriate for developmental age)         Values / Beliefs Cultural Requests During Hospitalization: None Spiritual Requests During  Hospitalization: None        Additional Information 1:1 In Past 12 Months?: No CIRT Risk: No Elopement Risk: No Does patient have medical clearance?: Yes     Disposition: Accepted to [facility] Bed 300-2.  Disposition Initial Assessment Completed for this Encounter: Yes Disposition of Patient: Inpatient treatment program Type of inpatient treatment program: Adult  On Site Evaluation by:   Reviewed with Physician:    Phillip Heal LaVerne 02/08/2014 11:23 AM

## 2014-02-08 NOTE — Progress Notes (Signed)
D: pt is sad and depressed, pt is teary eyed. denies withdrawal symptoms. denies si/hi/avh. denies pain. Pt refusing librium. Poor eye contact. forwards little  A: support and encouragement offered. q 15 min safety checks. Medications given R: pt remains safe on unit. No further complaints or signs of distress at this time

## 2014-02-08 NOTE — Progress Notes (Signed)
Pt ddi not attend wrap-up group this evening.

## 2014-02-08 NOTE — ED Notes (Signed)
Pt's box cutter taken by security and locked in safe.

## 2014-02-08 NOTE — ED Notes (Signed)
Pt arrived to the ED with a need for medical clearance.  Pt states he does not wish to live anymore but "can;t die."  Pt states his plan is to run until he can't run anymore.  Pt also states he is infested with bedbugs.  Pt has petechiae over his body.  Pt states he has been binging on crack, marijuana and alcohol for the last couple of weeks and would like help stopping.

## 2014-02-09 ENCOUNTER — Encounter (HOSPITAL_COMMUNITY): Payer: Self-pay | Admitting: Psychiatry

## 2014-02-09 DIAGNOSIS — F1994 Other psychoactive substance use, unspecified with psychoactive substance-induced mood disorder: Secondary | ICD-10-CM

## 2014-02-09 DIAGNOSIS — F1220 Cannabis dependence, uncomplicated: Secondary | ICD-10-CM

## 2014-02-09 DIAGNOSIS — R45851 Suicidal ideations: Secondary | ICD-10-CM

## 2014-02-09 DIAGNOSIS — F1420 Cocaine dependence, uncomplicated: Secondary | ICD-10-CM | POA: Diagnosis present

## 2014-02-09 DIAGNOSIS — F1010 Alcohol abuse, uncomplicated: Secondary | ICD-10-CM | POA: Diagnosis present

## 2014-02-09 DIAGNOSIS — F1020 Alcohol dependence, uncomplicated: Principal | ICD-10-CM

## 2014-02-09 MED ORDER — ENSURE COMPLETE PO LIQD
237.0000 mL | Freq: Two times a day (BID) | ORAL | Status: DC
  Administered 2014-02-10 – 2014-02-13 (×8): 237 mL via ORAL

## 2014-02-09 MED ORDER — NICOTINE 21 MG/24HR TD PT24
21.0000 mg | MEDICATED_PATCH | Freq: Every day | TRANSDERMAL | Status: DC
  Administered 2014-02-09 – 2014-02-13 (×5): 21 mg via TRANSDERMAL
  Filled 2014-02-09 (×8): qty 1

## 2014-02-09 NOTE — Progress Notes (Signed)
D: Patient denies HI and A/V hallucinations; patient having on and off thoughts of SI; patient reports sleep is fair; reports appetite is good; reports energy level is low; rates depression as 8/10; rates hopelessness 8/10; rates anxiety as 7/10;   A: Monitored q 15 minutes; patient encouraged to attend groups; patient educated about medications; patient given medications per physician orders; patient encouraged to express feelings and/or concerns  R: Patient is flat, sad, and depressed; patient is talking softly and slowly; patient is cooperative; patient's interaction with staff and peers is appropriate; patient was able to set goal to talk with staff 1:1 when having feelings of SI; patient is taking medications as prescribed and tolerating medications; patient is attending all groups

## 2014-02-09 NOTE — [facility] Group Notes (Signed)
[facility] L[facility] After[facility] Discharge Planning Group Note   02/09/2014 9:47 AM  Participation Quality:  DID NOT ATTEND (pt sleeping in room)   Smart, Hava Massingale LCSWA
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
## 2014-02-09 NOTE — [facility] Group Notes (Signed)
[facility] LCSW Group Therapy  02/09/2014 2:06 PM  Type of Therapy:  Group Therapy  Participation Level:  Active  Participation Quality:  Attentive  Affect:  Depressed and Flat  Cognitive:  Oriented  Insight:  Improving  Engagement in Therapy:  Improving  Modes of Intervention:  Confrontation, Discussion, Education, Exploration, Problem-solving, Rapport Building, Socialization and Support  Summary of Progress/Problems: Feelings around Relapse. Group members discussed the meaning of relapse and shared personal stories of relapse, how it affected them and others, and how they perceived themselves during this time. Group members were encouraged to identify triggers, warning signs and coping skills used when facing the possibility of relapse. Social supports were discussed and explored in detail. Post Acute Withdrawal Syndrome (handout provided) was introduced and examined. Pt's were encouraged to ask questions, talk about key points associated with PAWS, and process this information in terms of relapse prevention. Clayton NeedleMichael was attentive and engaged throughout today's therapy group. He shared his personal experience with PAWS and stated that he was happy to learn about what to expect. Clayton NeedleMichael demonstrates progress in the group setting and improving insight AEB his ability to process how having a safety plan will help him deal with PAWS in recovery.   Smart, Wilgus Deyton LCSWA  02/09/2014, 2:06 PM

## 2014-02-09 NOTE — Tx Team (Signed)
Interdisciplinary Treatment Plan Update (Adult)  Date: 02/09/2014  Time Reviewed:11:22 AM  Progress in Treatment:  Attending groups: No.  Participating in groups:  No.  Taking medication as prescribed: Yes  Tolerating medication: Yes  Family/Significant othe contact made: Not yet. SPE required for this pt.   Patient understands diagnosis: Yes, AEB seeking treatment for passive SI, ETOH detox, THC/Crack cocaine abuse, and mood stabilization.  Discussing patient identified problems/goals with staff: Yes  Medical problems stabilized or resolved: Yes  Denies suicidal/homicidal ideation: Passive SI/able to contract for safety on unit.  Patient has not harmed self or Others: Yes  New problem(s) identified:  Discharge Plan or Barriers: Pt not attending d/c planning at this time. CSW assessing for appropriate referrals.  Additional comments:42 y/o male who present voluntarily for SI with no plan, substance abuse (crack cocaine, etoh, marijuana) and depression. Patient states he has lost everything, his job, house and his family. Patient states he has lost the trust of his family due to his drug use. Patient states he is SI but states he does not have a plan. Patient states he would not kill himself but states he would just like for his life to end. Patient tearful during admission. Patient states he feel guilty for what he has done to his family. Patient states he uses crack cocaine daily (as much as his money will buy). Patient states he used etoh approximately once or twice a week and states he uses cocaine occasionally. Patient currently states he is passive SI but contracts for safety. Patient denies HI but states he is unsure if he hears voices but states when he is using drugs he thinks he does. Patient states he was staying in a crack house states was infested with bed bugs. Patient has scabbed areas to bilateral arms, bilateral ankles and chest. Per ed notes MD stated it was not bed bug bites but r/t  drug use.   Reason for Continuation of Hospitalization: Librium taper-withdrawals Mood stabilization Medication management  SI Estimated length of stay: 3-5 days  For review of initial/current patient goals, please see plan of care.  Attendees:  Patient:    Family:    Physician: Geoffery LyonsIrving Lugo MD 02/09/2014 11:21 AM   Nursing: Brayton ElBritney RN   Clinical Social Worker Chartered loss adjusterHeather Smart, LCSWA    Other: Meryl DareJennifer P. RN   Other: Darden DatesJennifer C. Nurse CM   Other: Chandra BatchAggie N. PA   Other: Engineer, productionlena Pharmacist    Scribe for Treatment Team:  The Sherwin-WilliamsHeather Smart LCSWA 02/09/2014 11:22 AM

## 2014-02-09 NOTE — Progress Notes (Signed)
Pt resting in bed with eyes closed. No acute distress, no complaints voiced. Level III obs in place for safety and pt is safe. Clayton Howell, Clayton Howell

## 2014-02-09 NOTE — Progress Notes (Signed)
NUTRITION ASSESSMENT  Pt identified as at risk on the Malnutrition Screen Tool  INTERVENTION: 1. Educated patient on the importance of nutrition and encouraged intake of food and beverages. 2. Discussed weight goals. 3. Supplements: MVI and thiamine and Ensure Complete po BID, each supplement provides 350 kcal and 13 grams of protein   NUTRITION DIAGNOSIS: Unintentional weight loss related to sub-optimal intake as evidenced by pt report.   Goal: Pt to meet >/= 90% of their estimated nutrition needs.  Monitor:  PO intake  Assessment:  Patient admitted with SI, depression, drug abuse (crack cocaine, etoh, THC).  Patient states that his appetite is good and is eating well here but spent the last 3 weeks in a crack house and did not eat.  Patient with obvious decrease of muscle mass and body fat.    Patient meets criteria for severe malnutrition related to social/environmental causes AEB by observable decreased body fat and muscle mass <50% intake for the past month.  43 y.o. male  Height: Ht Readings from Last 1 Encounters:  02/08/14 5\' 9"  (1.753 m)    Weight: Wt Readings from Last 1 Encounters:  02/08/14 164 lb (74.39 kg)    Weight Hx: Wt Readings from Last 10 Encounters:  02/08/14 164 lb (74.39 kg)    BMI:  Body mass index is 24.21 kg/(m^2). Pt meets criteria for normal weight based on current BMI.  Estimated Nutritional Needs: Kcal: 25-30 kcal/kg Protein: > 1 gram protein/kg Fluid: 1 ml/kcal  Diet Order: General Pt is also offered choice of unit snacks mid-morning and mid-afternoon.  Pt is eating as desired.   Lab results and medications reviewed.   Oran ReinLaura Jobe, RD, LDN Clinical Inpatient Dietitian Pager:  [phone] Weekend and after hours pager:  [phone]
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
## 2014-02-09 NOTE — [facility] Suicide Risk Assessment (Signed)
Suicide Risk Assessment  Admission Assessment     Nursing information obtained from:  Patient Demographic factors:  Male;Caucasian;Divorced or widowed;Low socioeconomic status;Unemployed Current Mental Status:  Suicidal ideation indicated by patient Loss Factors:  Financial problems / change in socioeconomic status Historical Factors:  Family history of mental illness or substance abuse Risk Reduction Factors:  Positive social support;Sense of responsibility to family Total Time spent with patient: 1 hour  CLINICAL FACTORS:   Depression:   Comorbid alcohol abuse/dependence Impulsivity Insomnia Alcohol/Substance Abuse/Dependencies  Psychiatric Specialty Exam:     Blood pressure 124/79, pulse 61, temperature 97.4 F (36.3 C), temperature source Oral, resp. rate 20, height 5\' 9"  (1.753 m), weight 74.39 kg (164 lb).Body mass index is 24.21 kg/(m^2).  General Appearance: Disheveled  Eye SolicitorContact::  Fair  Speech:  Clear and Coherent  Volume:  fluctuates  Mood:  Anxious, Depressed, Dysphoric, Hopeless, Irritable and Worthless  Affect:  Labile  Thought Process:  Coherent and Goal Directed  Orientation:  Full (Time, Place, and Person)  Thought Content:  events, symtpoms, worries, concerns  Suicidal Thoughts:  Yes.  without intent/plan  Homicidal Thoughts:  No  Memory:  Immediate;   Fair Recent;   Fair Remote;   Fair  Judgement:  Fair  Insight:  Present  Psychomotor Activity:  Restlessness  Concentration:  Fair  Recall:  [employer]  Fund of Knowledge:NA  Language: Fair  Akathisia:  No  Handed:    AIMS (if indicated):     Assets:  Desire for Improvement  Sleep:  Number of Hours: 6.5   Musculoskeletal: Strength & Muscle Tone: within normal limits Gait & Station: normal Patient leans: N/A  COGNITIVE FEATURES THAT CONTRIBUTE TO RISK:  Closed-mindedness Polarized thinking Thought constriction (tunnel vision)    SUICIDE RISK:   Moderate:  Frequent suicidal ideation with limited  intensity, and duration, some specificity in terms of plans, no associated intent, good self-control, limited dysphoria/symptomatology, some risk factors present, and identifiable protective factors, including available and accessible social support.  PLAN OF CARE: Supportive approach/coping skills/relapse prevention                               Detox                               Reassess and address the co morbidities  I certify that inpatient services furnished can reasonably be expected to improve the patient's condition.  Caryn Gienger A 02/09/2014, 5:53 PM

## 2014-02-10 DIAGNOSIS — F39 Unspecified mood [affective] disorder: Secondary | ICD-10-CM

## 2014-02-10 DIAGNOSIS — F321 Major depressive disorder, single episode, moderate: Secondary | ICD-10-CM

## 2014-02-10 MED ORDER — LAMOTRIGINE 25 MG PO TABS
25.0000 mg | ORAL_TABLET | Freq: Every day | ORAL | Status: DC
  Administered 2014-02-10 – 2014-02-13 (×4): 25 mg via ORAL
  Filled 2014-02-10 (×6): qty 1
  Filled 2014-02-10: qty 14

## 2014-02-10 NOTE — Progress Notes (Signed)
Adult Psychoeducational Group Note  Date:  02/10/2014 Time:  6:06 PM  Group Topic/Focus:  Coping With Mental Health Crisis:   The purpose of this group is to help patients identify strategies for coping with mental health crisis.  Group discusses possible causes of crisis and ways to manage them effectively.  Participation Level:  Did Not Attend  Participation Quality:    Affect:     Cognitive:    Insight:   Engagement in Group:    Modes of Intervention:    Additional Comments:  Patient did not attend group.  Wynema BirchCagle, Trudi Morgenthaler D 02/10/2014, 6:06 PM

## 2014-02-10 NOTE — Progress Notes (Signed)
D: pt stated he has been really anxious today. Rates his depression 7/10. passive si with no plan. Pt contracts for safety. Rates back pain 4/10. Pt is cooperative, anxious mood.  A: 1:1 given. Writer spoke with pt about trying to talk about what is going on instead of holding it in. q 15 min safety checks. meds given R: pt remains safe on unit. No further complaints or signs of distress noted.

## 2014-02-10 NOTE — Progress Notes (Signed)
Patient did not attend the evening speaker AA meeting. Pt remained in room during group time.

## 2014-02-10 NOTE — [facility] Group Notes (Signed)
[facility] Group Notes:  (Nursing/MHT/Case Management/Adjunct)  Date: 02/10/2014  Time: 11:14 AM  Type of Therapy: Psychoeducational Skills  Participation Level: Active  Participation Quality: Appropriate  Affect: Appropriate  Cognitive: Appropriate  Insight: Appropriate  Engagement in Group: Engaged  Modes of Intervention: Discussion, Education, Orientation, Problem-solving and Support  Summary of Progress/Problems: Pt participated in self inventory group. Pt educated on health coping skills packet with discussion. Reminded pt to address treatment questions and concerns when assessed today by provider.  Inda MerlinWilliams, Lanessa Shill R  02/10/2014, 11:14 AM

## 2014-02-10 NOTE — Progress Notes (Signed)
Pt attended AA group this evening.  

## 2014-02-10 NOTE — Progress Notes (Signed)
[facility]  02/10/2014 5:47 PM Clayton Howell  MRN:  [id number] Subjective:  Does report a long history of problems with mood. Has has recurrent depression as well a fluctuans with anger and irritability. States that this mood instability had been present even when he had  not been drinking or using drugs. Shares a long history of dysfunction, his resentment for   the way he is perceived as a "crak head" Also dealing with the shame and the guilt for what his addiction has done to his kids Diagnosis:   DSM5: Schizophrenia Disorders:  none Obsessive-Compulsive Disorders:  none Trauma-Stressor Disorders:  none Substance/Addictive Disorders:  Alcohol Related Disorder - Moderate (303.90), Cocaine related Disorder  Depressive Disorders:  Major Depressive Disorder - Moderate (296.22) Total Time spent with patient: 30 minutes  Axis I: Mood Disorder NOS  ADL's:  Intact  Sleep: Fair  Appetite:  Fair  Suicidal Ideation:  Plan:  denies Intent:  denies Means:  denies Homicidal Ideation:  Plan:  denies Intent:  denies Means:  denies AEB (as evidenced by):  Psychiatric Specialty Exam: Physical Exam  Review of Systems  Constitutional: Positive for malaise/fatigue.  HENT: Negative.   Eyes: Negative.   Respiratory: Negative.   Cardiovascular: Negative.   Gastrointestinal: Negative.   Genitourinary: Negative.   Musculoskeletal: Negative.   Skin: Negative.   Neurological: Negative.   Endo/Heme/Allergies: Negative.   Psychiatric/Behavioral: Positive for depression and substance abuse. The patient is nervous/anxious and has insomnia.     Blood pressure 121/75, pulse 81, temperature 97.4 F (36.3 C), temperature source Oral, resp. rate 20, height 5\' 9"  (1.753 m), weight 74.39 kg (164 lb).Body mass index is 24.21 kg/(m^2).  General Appearance: Disheveled  Eye SolicitorContact::  Fair  Speech:  Clear and Coherent and Slow  Volume:  Decreased  Mood:  Anxious and Depressed  Affect:  sad,  anxious, worried, tearful  Thought Process:  Coherent and Goal Directed  Orientation:  Full (Time, Place, and Person)  Thought Content:  symtpoms, worries, concerns, shame and guilt  Suicidal Thoughts:  No  Homicidal Thoughts:  No  Memory:  Immediate;   Fair Recent;   Fair Remote;   Fair  Judgement:  Fair  Insight:  Present and Shallow  Psychomotor Activity:  Restlessness  Concentration:  Fair  Recall:  [employer]ervFair  Fund of Knowledge:NA  Language: Fair  Akathisia:  No  Handed:    AIMS (if indicated):     Assets:  Desire for Improvement  Sleep:  Number of Hours: 6.75   Musculoskeletal: Strength & Muscle Tone: within normal limits Gait & Station: normal Patient leans: N/A  Current Medications: Current Facility-Administered Medications  Medication Dose Route Frequency Provider Last Rate Last Dose  . acetaminophen (TYLENOL) tablet 650 mg  650 mg Oral Q4H PRN Benjaman PottGerald D Taylor, MD      . alum & mag hydroxide-simeth (MAALOX/MYLANTA) 200-200-20 MG/5ML suspension 30 mL  30 mL Oral PRN Benjaman PottGerald D Taylor, MD      . chlordiazePOXIDE (LIBRIUM) capsule 25 mg  25 mg Oral Q6H PRN Beau FannyJohn C Withrow, FNP      . chlordiazePOXIDE (LIBRIUM) capsule 25 mg  25 mg Oral Once Beau FannyJohn C Withrow, FNP      . chlordiazePOXIDE (LIBRIUM) capsule 25 mg  25 mg Oral TID Beau FannyJohn C Withrow, FNP   25 mg at 02/10/14 1253   Followed by  . [START ON 02/11/2014] chlordiazePOXIDE (LIBRIUM) capsule 25 mg  25 mg Oral BH-qamhs Beau FannyJohn C Withrow, FNP  Followed by  . [START ON 02/13/2014] chlordiazePOXIDE (LIBRIUM) capsule 25 mg  25 mg Oral Daily Beau Fanny, FNP      . feeding supplement (ENSURE COMPLETE) (ENSURE COMPLETE) liquid 237 mL  237 mL Oral BID BM Jeoffrey Massed, RD   237 mL at 02/10/14 1404  . FLUoxetine (PROZAC) capsule 20 mg  20 mg Oral Daily Benjaman Pott, MD   20 mg at 02/10/14 0803  . hydrOXYzine (ATARAX/VISTARIL) tablet 25 mg  25 mg Oral Q6H PRN Beau Fanny, FNP   25 mg at 02/09/14 2130  . lamoTRIgine (LAMICTAL)  tablet 25 mg  25 mg Oral Daily Rachael Fee, MD   25 mg at 02/10/14 1543  . loperamide (IMODIUM) capsule 2-4 mg  2-4 mg Oral PRN Beau Fanny, FNP      . magnesium hydroxide (MILK OF MAGNESIA) suspension 30 mL  30 mL Oral Daily PRN Beau Fanny, FNP      . multivitamin with minerals tablet 1 tablet  1 tablet Oral Daily Beau Fanny, FNP   1 tablet at 02/10/14 0802  . nicotine (NICODERM CQ - dosed in mg/24 hours) patch 21 mg  21 mg Transdermal Daily Rachael Fee, MD   21 mg at 02/10/14 0804  . ondansetron (ZOFRAN) tablet 4 mg  4 mg Oral Q8H PRN Benjaman Pott, MD      . ondansetron (ZOFRAN-ODT) disintegrating tablet 4 mg  4 mg Oral Q6H PRN Beau Fanny, FNP      . thiamine (B-1) injection 100 mg  100 mg Intramuscular Once Beau Fanny, FNP      . thiamine (VITAMIN B-1) tablet 100 mg  100 mg Oral Daily Beau Fanny, FNP   100 mg at 02/10/14 0802    Lab Results: No results found for this or any previous visit (from the past 48 hour(s)).  Physical Findings: AIMS: Facial and Oral Movements Muscles of Facial Expression: None, normal Lips and Perioral Area: None, normal Jaw: None, normal Tongue: None, normal,Extremity Movements Upper (arms, wrists, hands, fingers): None, normal Lower (legs, knees, ankles, toes): None, normal, Trunk Movements Neck, shoulders, hips: None, normal, Overall Severity Severity of abnormal movements (highest score from questions above): None, normal Incapacitation due to abnormal movements: None, normal Patient's awareness of abnormal movements (rate only patient's report): No Awareness, Dental Status Current problems with teeth and/or dentures?: No Does patient usually wear dentures?: No  CIWA:  CIWA-Ar Total: 2 COWS:     Treatment Plan Summary: Daily contact with patient to assess and evaluate symptoms and progress in treatment Medication management  Plan: Supportive approach/coping skills/relapse prevention           Reassess and address the co  morbidities           Trial with Lamictal 25 mg daily                 CBT: identify cognitive distortions  Medical Decision Making Problem Points:  Review of psycho-social stressors (1) Data Points:  Review of medication regiment & side effects (2) Review of new medications or change in dosage (2)  I certify that inpatient services furnished can reasonably be expected to improve the patient's condition.   Mayleigh Tetrault A 02/10/2014, 5:47 PM

## 2014-02-10 NOTE — Progress Notes (Signed)
02-10-14  NSG NOTE  7a-3p  D: Affect is depressed.  Mood is depressed.  Behavior is cooperative with encouragement, direction and support.  Interacts appropriately with peers and staff.  Participated in self inventory group, counselor lead group and healthy coping skills group.  Pt reports poor sleep and good appetite.  States that his energy level is low but that ability to pay attention is good.  Able to verbalize changes that will have to be made to be successful post discharge.    A:  Medications per MD order.  Support given throughout day.  1:1 time spent with pt.  R:  Following treatment plan.  Reports passive SI.  Denies HI, auditory or visual hallucinations.  Contracts for safety.

## 2014-02-10 NOTE — [facility] Group Notes (Signed)
[facility] Group Notes:  (Nursing/MHT/Case Management/Adjunct)  Date:  02/10/2014  Time:  2:22 PM  Type of Therapy:  Psychoeducational Skills  Participation Level:  Active  Participation Quality:  Appropriate  Affect:  Appropriate  Cognitive:  Appropriate  Insight:  Appropriate  Engagement in Group:  Engaged  Modes of Intervention:  Education and Support  Summary of Progress/Problems: Video and discussion on how behavior effects addiction.  Azalie Harbeck R 02/10/2014, 2:22 PM 

## 2014-02-10 NOTE — [facility] Group Notes (Signed)
[facility] LCSW Group Therapy  02/10/2014 12:25 PM  Type of Therapy:  Group Therapy  Participation Level:  Minimal  Participation Quality:  Drowsy  Affect:  Lethargic  Cognitive:  Appropriate  Insight:  Limited  Engagement in Therapy:  Lacking  Modes of Intervention:  Discussion, Education, Exploration, Rapport Building, Socialization and Support  Summary of Progress/Problems: Pt was in group therapy however slept during most of discussion.  Pt did identify one coping skill when it came time to share to the group and then fell right back to sleep.   Seabron SpatesVaughn, Caryl Fate Anne 02/10/2014, 12:25 PM

## 2014-02-11 NOTE — Progress Notes (Signed)
Patient ID: Clayton GriffesMichael S Hoeger, male   DOB: 1971/03/20, 43 y.o.   MRN: [id number]  D: Pt has been very flat and depressed on the unit. Pt reported the he was admitted for depression and using crack. Pt reported that he went on a crack binge and that he had not seen his family in weeks but they came to see him yesterday and he was very happy. Pt reported being negative SI/HI, no AH/VH noted. A: 15 min checks continued for patient safety. R: Pt safety maintained.

## 2014-02-11 NOTE — Progress Notes (Signed)
[facility] MD Progress Note  02/11/2014 5:40 PM Clayton Howell  MRN:  [id number] Subjective:  Clayton Howell continues to detox. Still dealing with a lot of shame and guilt. His kids visited yesterday. He states that he used to be one visiting his son when he was in the adolescent unit. Stats that he cant get over the feeling that he failed them. He states that he sold the TV to get money for drugs but he was given $200 worth of counterfeit money for it. He still evidences negative self talk, very poor self steem,  Diagnosis:   DSM5: Schizophrenia Disorders:  none Obsessive-Compulsive Disorders:  none Trauma-Stressor Disorders:  none Substance/Addictive Disorders:  Alcohol Related Disorder - Moderate (303.90), Cocaine use disorder severe Depressive Disorders:  Major Depressive Disorder - Moderate (296.22) Total Time spent with patient: 30 minutes  Axis I: Mood Disorder NOS  ADL's:  Intact  Sleep: Fair  Appetite:  Fair  Suicidal Ideation:  Plan:  denies Intent:  denies Means:  denies Homicidal Ideation:  Plan:  denies Intent:  denies Means:  denies AEB (as evidenced by):  Psychiatric Specialty Exam: Physical Exam  Review of Systems  Constitutional: Positive for malaise/fatigue.  HENT: Negative.   Eyes: Negative.   Respiratory: Negative.   Cardiovascular: Negative.   Gastrointestinal: Negative.   Genitourinary: Negative.   Musculoskeletal: Negative.   Skin: Negative.   Neurological: Positive for weakness.  Psychiatric/Behavioral: Positive for depression and substance abuse. The patient is nervous/anxious.     Blood pressure 125/88, pulse 65, temperature 97.4 F (36.3 C), temperature source Oral, resp. rate 20, height 5\' 9"  (1.753 m), weight 74.39 kg (164 lb).Body mass index is 24.21 kg/(m^2).  General Appearance: Fairly Groomed  Patent attorney::  Fair  Speech:  Clear and Coherent  Volume:  Decreased  Mood:  Anxious and Depressed  Affect:  Restricted  Thought Process:  Coherent and  Goal Directed  Orientation:  Full (Time, Place, and Person)  Thought Content:  symptoms, worries, concerns, shame guilt  Suicidal Thoughts:  No  Homicidal Thoughts:  No  Memory:  Immediate;   Fair Recent;   Fair Remote;   Fair  Judgement:  Fair  Insight:  Present and Shallow  Psychomotor Activity:  Restlessness  Concentration:  Fair  Recall:  [employer] of Knowledge:NA  Language: Fair  Akathisia:  No  Handed:    AIMS (if indicated):     Assets:  Desire for Improvement  Sleep:  Number of Hours: 6.75   Musculoskeletal: Strength & Muscle Tone: within normal limits Gait & Station: normal Patient leans: N/A  Current Medications: Current Facility-Administered Medications  Medication Dose Route Frequency Provider Last Rate Last Dose  . acetaminophen (TYLENOL) tablet 650 mg  650 mg Oral Q4H PRN Benjaman Pott, MD   650 mg at 02/10/14 2108  . alum & mag hydroxide-simeth (MAALOX/MYLANTA) 200-200-20 MG/5ML suspension 30 mL  30 mL Oral PRN Benjaman Pott, MD      . chlordiazePOXIDE (LIBRIUM) capsule 25 mg  25 mg Oral Q6H PRN Beau Fanny, FNP      . chlordiazePOXIDE (LIBRIUM) capsule 25 mg  25 mg Oral Once Beau Fanny, FNP      . chlordiazePOXIDE (LIBRIUM) capsule 25 mg  25 mg Oral BH-qamhs Beau Fanny, FNP       Followed by  . [START ON 02/13/2014] chlordiazePOXIDE (LIBRIUM) capsule 25 mg  25 mg Oral Daily Beau Fanny, FNP      . feeding  supplement (ENSURE COMPLETE) (ENSURE COMPLETE) liquid 237 mL  237 mL Oral BID BM Jeoffrey MassedLaura Lee Jobe, RD   237 mL at 02/11/14 1400  . FLUoxetine (PROZAC) capsule 20 mg  20 mg Oral Daily Benjaman PottGerald D Taylor, MD   20 mg at 02/11/14 0740  . hydrOXYzine (ATARAX/VISTARIL) tablet 25 mg  25 mg Oral Q6H PRN Beau FannyJohn C Withrow, FNP   25 mg at 02/10/14 2109  . lamoTRIgine (LAMICTAL) tablet 25 mg  25 mg Oral Daily Rachael FeeIrving A Britanni Yarde, MD   25 mg at 02/11/14 0740  . loperamide (IMODIUM) capsule 2-4 mg  2-4 mg Oral PRN Beau FannyJohn C Withrow, FNP      . magnesium hydroxide (MILK OF  MAGNESIA) suspension 30 mL  30 mL Oral Daily PRN Beau FannyJohn C Withrow, FNP   30 mL at 02/10/14 2109  . multivitamin with minerals tablet 1 tablet  1 tablet Oral Daily Beau FannyJohn C Withrow, FNP   1 tablet at 02/11/14 0740  . nicotine (NICODERM CQ - dosed in mg/24 hours) patch 21 mg  21 mg Transdermal Daily Rachael FeeIrving A Sebrena Engh, MD   21 mg at 02/11/14 0740  . ondansetron (ZOFRAN) tablet 4 mg  4 mg Oral Q8H PRN Benjaman PottGerald D Taylor, MD      . ondansetron (ZOFRAN-ODT) disintegrating tablet 4 mg  4 mg Oral Q6H PRN Beau FannyJohn C Withrow, FNP      . thiamine (B-1) injection 100 mg  100 mg Intramuscular Once Beau FannyJohn C Withrow, FNP      . thiamine (VITAMIN B-1) tablet 100 mg  100 mg Oral Daily Beau FannyJohn C Withrow, FNP   100 mg at 02/11/14 0740    Lab Results: No results found for this or any previous visit (from the past 48 hour(s)).  Physical Findings: AIMS: Facial and Oral Movements Muscles of Facial Expression: None, normal Lips and Perioral Area: None, normal Jaw: None, normal Tongue: None, normal,Extremity Movements Upper (arms, wrists, hands, fingers): None, normal Lower (legs, knees, ankles, toes): None, normal, Trunk Movements Neck, shoulders, hips: None, normal, Overall Severity Severity of abnormal movements (highest score from questions above): None, normal Incapacitation due to abnormal movements: None, normal Patient's awareness of abnormal movements (rate only patient's report): No Awareness, Dental Status Current problems with teeth and/or dentures?: No Does patient usually wear dentures?: No  CIWA:  CIWA-Ar Total: 0 COWS:     Treatment Plan Summary: Daily contact with patient to assess and evaluate symptoms and progress in treatment Medication management  Plan: Supportive approach/coping skills/relapse prevention           Optimize treatment with psychotropics           CBT;identify distortions  Medical Decision Making Problem Points:  Review of psycho-social stressors (1) Data Points:  Review of medication  regiment & side effects (2)  I certify that inpatient services furnished can reasonably be expected to improve the patient's condition.   Indiana Pechacek A 02/11/2014, 5:41 PM

## 2014-02-11 NOTE — [facility] Group Notes (Signed)
[facility] Group Notes:  (Nursing/MHT/Case Management/Adjunct)  Date:  02/11/2014  Time:  10:07 AM  Type of Therapy:  Psychoeducational Skills  Participation Level:  Active  Participation Quality:  Appropriate  Affect:  Appropriate  Cognitive:  Appropriate  Insight:  Appropriate  Engagement in Group:  Engaged  Modes of Intervention:  Discussion  Summary of Progress/Problems: Pt did attend self inventory group, pt reported that he was negative SI/HI, no AH/VH noted. Pt rated his depression as a 8, and his helplessness/hopelessness as a 8.     Jacquelyne BalintForrest, Larua Collier Shanta 02/11/2014, 10:07 AM

## 2014-02-11 NOTE — Progress Notes (Signed)
Patient did attend the evening speaker AA meeting.  

## 2014-02-11 NOTE — Progress Notes (Signed)
D: pt stating he is doing okay. C/o constipation and having painful BM. Denies si/hi/avh. Denies pain. Pt is cooperative and calm. Minimal with contact and forwards little. Flat, sad facial expression.  A: support and encouragement given. q 15 min safety checks. scheduled medication given. Milk of magnesium given for constipation  R: pt remains safe on unit. No further complaints or signs of distress at this time

## 2014-02-12 NOTE — [facility] Suicide Risk Assessment (Signed)
[facility] INPATIENT:  Family/Significant Other Suicide Prevention Education  Suicide Prevention Education:  Education Completed; Sharren Bridgelen Sclosser (pt's aunt) [phone] has been identified by the patient as the family member/significant other with whom the patient will be residing, and identified as the person(s) who will aid the patient in the event of a mental health crisis (suicidal ideations/suicide attempt).  With written consent from the patient, the family member/significant other has been provided the following suicide prevention education, prior to the and/or following the discharge of the patient.  The suicide prevention education provided includes the following:  Suicide risk factors  Suicide prevention and interventions  National Suicide Hotline telephone number  [facility] assessment telephone number  [facility] Emergency Assistance 911  [facility] and/or Residential Mobile Crisis [facility] telephone number  Request made of family/significant other to:  Remove weapons (e.g., guns, rifles, knives), all items previously/currently identified as safety concern.    Remove drugs/medications (over-the-counter, prescriptions, illicit drugs), all items previously/currently identified as a safety concern.  The family member/significant other verbalizes understanding of the suicide prevention education information provided.  The family member/significant other agrees to remove the items of safety concern listed above.  Smart, Special Ranes LCSWA  02/12/2014, 12:32 PM

## 2014-02-12 NOTE — Progress Notes (Addendum)
Patient ID: Verneita GriffesMichael S Grimmer, male   DOB: [birth year], 43 y.o.   MRN: [id number]  D: Patient with dull, flat affect endorsing depression. No inappropriate behaviors noted. A: Q 15 minute safety checks, encourage staff/peer interaction and group participation. Administer medications as ordered by MD. R: Pt compliant with medications, but did attend group session. No distress noted. Pt denies SI/HI and verbally contracts for safety.

## 2014-02-12 NOTE — [facility] Group Notes (Signed)
[facility] Note   02/12/2014 9:25 AM  Participation Quality:  Appropriate   Mood/Affect:  Appropriate  Depression Rating:  0  Anxiety Rating:  0  Thoughts of Suicide:  No Will you contract for safety?   NA  Current AVH:  No  Plan for Discharge/Comments:  [facility]. Reports no SI.   Transportation Means: taxi   Supports: kids.   Smart, American FinancialHeather LCSWA

## 2014-02-12 NOTE — [facility] Group Notes (Signed)
[facility] LCSW Group Therapy  02/12/2014 3:11 PM  Type of Therapy:  Group Therapy  Participation Level:  Active  Participation Quality:  Attentive  Affect:  Appropriate  Cognitive:  Alert and Oriented  Insight:  Engaged  Engagement in Therapy:  Engaged  Modes of Intervention:  Confrontation, Discussion, Education, Exploration, Problem-solving, Rapport Building, Socialization and Support  Summary of Progress/Problems: Today's Topic: Overcoming Obstacles. Pt identified obstacles faced currently and processed barriers involved in overcoming these obstacles. Pt identified steps necessary for overcoming these obstacles and explored motivation (internal and external) for facing these difficulties head on. Pt further identified one area of concern in their lives and chose a skill of focus pulled from their "toolbox." Clayton Howell was pleasant and attentive throughout today's therapy group. He shared that he is worried about his depression getting out of control again and relapsing. Clayton Howell shows progress in the group setting and improving insight AEB his ability to process how [facility] and continuing to mend his relationship with family and his children will help him overcome these obstacles.    Clayton Howell, Clayton Howell LCSWA  02/12/2014, 3:11 PM

## 2014-02-12 NOTE — Progress Notes (Signed)
Adult Psychoeducational Group Note  Date:  02/12/2014 Time:  10:00 am  Group Topic/Focus:  Wellness Toolbox:   The focus of this group is to discuss various aspects of wellness, balancing those aspects and exploring ways to increase the ability to experience wellness.  Patients will create a wellness toolbox for use upon discharge.  Participation Level:  Did Not Attend  Modena NunneryJohnson, Alison Breeding 02/12/2014, 10:53 AM

## 2014-02-12 NOTE — Progress Notes (Signed)
[facility]  02/12/2014 1:40 PM Clayton Howell  MRN:  [id number] Subjective: Clayton Howell continues to detox. He is looking back at his pattern of mood instability even when he was not using. 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  HENT: Negative.   Eyes: Negative.   Respiratory: Negative.   Cardiovascular: Negative.   Gastrointestinal: Negative.   Genitourinary: Negative.   Musculoskeletal: Negative.   Skin: Negative.   Neurological: Negative.   Endo/Heme/Allergies: Negative.   Psychiatric/Behavioral: Positive for depression and substance abuse. The patient is nervous/anxious.     Blood pressure 124/87, pulse 66, temperature 97.4 F (36.3 C), temperature source Oral, resp. rate 16, height 5\' 9"  (1.753 m), weight 74.39 kg (164 lb).Body mass index is 24.21 kg/(m^2).  General Appearance: Fairly Groomed  Patent attorneyye Contact::  Fair  Speech:  Clear and Coherent  Volume:  Normal  Mood:  Anxious and worried  Affect:  anxious, worried   Thought Process:  Coherent and Goal Directed  Orientation:  Full (Time, Place, and Person)  Thought Content:  symtpoms, worries concerns  Suicidal Thoughts:  No  Homicidal Thoughts:  No  Memory:  Immediate;   Fair Recent;   Fair Remote;   Fair  Judgement:  Fair  Insight:  Present  Psychomotor Activity:  Restlessness  Concentration:  Fair  Recall:  [employer]Fair  Fund of Knowledge:NA  Language: Fair  Akathisia:  No  Handed:    AIMS (if indicated):     Assets:  Desire for Improvement  Sleep:  Number of Hours: 6.25   Musculoskeletal: Strength & Muscle Tone: within normal limits Gait & Station: normal Patient leans: N/A  Current Medications: Current Facility-Administered Medications  Medication Dose Route Frequency Provider Last Rate Last Dose  . acetaminophen (TYLENOL) tablet 650 mg  650 mg Oral Q4H PRN Benjaman PottGerald D Taylor, MD   650 mg at 02/10/14 2108  . alum & mag hydroxide-simeth (MAALOX/MYLANTA) 200-200-20 MG/5ML suspension 30 mL  30 mL Oral PRN Benjaman PottGerald D Taylor, MD      . chlordiazePOXIDE (LIBRIUM) capsule 25 mg  25 mg Oral Once Beau FannyJohn C Withrow, FNP      . [START ON 02/13/2014] chlordiazePOXIDE (LIBRIUM) capsule 25 mg  25 mg Oral Daily Beau FannyJohn C Withrow, FNP      . feeding supplement (ENSURE COMPLETE) (ENSURE COMPLETE) liquid 237 mL  237 mL Oral BID BM Jeoffrey MassedLaura Lee Jobe, RD   237 mL at 02/11/14 1400  . FLUoxetine (PROZAC) capsule 20 mg  20 mg Oral Daily Benjaman PottGerald D Taylor, MD   20 mg at 02/12/14 0843  . hydrOXYzine (ATARAX/VISTARIL)  tablet 25 mg  25 mg Oral Q6H PRN Beau Fanny, FNP   25 mg at 02/11/14 2140  . lamoTRIgine (LAMICTAL) tablet 25 mg  25 mg Oral Daily Rachael Fee, MD   25 mg at 02/12/14 [phone]  . magnesium hydroxide (MILK OF MAGNESIA) suspension 30 mL  30 mL Oral Daily PRN Beau Fanny, FNP   30 mL at 02/10/14 2109  . multivitamin with minerals tablet 1 tablet  1 tablet Oral Daily Beau Fanny, FNP   1 tablet at 02/12/14 [phone]  . nicotine (NICODERM CQ - dosed in mg/24 hours) patch  21 mg  21 mg Transdermal Daily Rachael Fee, MD   21 mg at 02/12/14 0843  . ondansetron (ZOFRAN) tablet 4 mg  4 mg Oral Q8H PRN Benjaman Pott, MD      . thiamine (B-1) injection 100 mg  100 mg Intramuscular Once Beau Fanny, FNP      . thiamine (VITAMIN B-1) tablet 100 mg  100 mg Oral Daily Beau Fanny, FNP   100 mg at 02/12/14 5409    Lab Results: No results found for this or any previous visit (from the past 48 hour(s)).  Physical Findings: AIMS: Facial and Oral Movements Muscles of Facial Expression: None, normal Lips and Perioral Area: None, normal Jaw: None, normal Tongue: None, normal,Extremity Movements Upper (arms, wrists, hands, fingers): None, normal Lower (legs, knees, ankles, toes): None, normal, Trunk Movements Neck, shoulders, hips: None, normal, Overall Severity Severity of abnormal movements (highest score from questions above): None, normal Incapacitation due to abnormal movements: None, normal Patient's awareness of abnormal movements (rate only patient's report): No Awareness, Dental Status Current problems with teeth and/or dentures?: No Does patient usually wear dentures?: No  CIWA:  CIWA-Ar Total: 0 COWS:     Treatment Plan Summary: Daily contact with patient to assess and evaluate symptoms and progress in treatment Medication management  Plan: Supportive approach/coping skills/relapse prevention           Continue detox           Reassess and optimize treatment for the co morbidities  Medical Decision Making Problem Points:  Review of psycho-social stressors (1) Data Points:  Review of medication regiment & side effects (2) Review of new medications or change in dosage (2)  I certify that inpatient services furnished can reasonably be expected to improve the patient's condition.   Liliane Mallis A 02/12/2014, 1:40 PM

## 2014-02-12 NOTE — Progress Notes (Signed)
Patient ID: Clayton Howell, male   DOB: September 16, 1971, 43 y.o.   MRN: [id number] He has been up parts of the day. Self inventory: depression 5. Hopelessness 5 withdrawals of diarrhes and craving. Refused off of prn medication for diarrhea.

## 2014-02-13 MED ORDER — HYDROXYZINE HCL 25 MG PO TABS
25.0000 mg | ORAL_TABLET | Freq: Three times a day (TID) | ORAL | Status: DC | PRN
  Filled 2014-02-13: qty 42

## 2014-02-13 MED ORDER — FLUOXETINE HCL 20 MG PO CAPS
20.0000 mg | ORAL_CAPSULE | Freq: Every day | ORAL | Status: AC
Start: 2014-02-13 — End: ?

## 2014-02-13 MED ORDER — LAMOTRIGINE 25 MG PO TABS: 25.0000 mg | ORAL_TABLET | Freq: Every day | ORAL | Status: AC

## 2014-02-13 MED ORDER — HYDROXYZINE HCL 25 MG PO TABS: ORAL_TABLET | ORAL | Status: AC

## 2014-02-13 NOTE — Progress Notes (Signed)
Adult Psychoeducational Group Note  Date:  02/13/2014 Time:  2:46 AM  Group Topic/Focus:  Wrap-Up Group:   The focus of this group is to help patients review their daily goal of treatment and discuss progress on daily workbooks.  Participation Level:  Active  Participation Quality:  Sharing  Affect:  Appropriate  Cognitive:  Appropriate  Insight: Good  Engagement in Group:  Engaged  Modes of Intervention:  Discussion  Additional Comments:  Patient was very sharing and stating the changes he need to make on the outside to become sober.  [name], [name] 02/13/2014, 2:46 AM

## 2014-02-13 NOTE — Progress Notes (Signed)
Patient ID: Verneita GriffesMichael S Jorgensen, male   DOB: 12-17-1970, 43 y.o.   MRN: [id number] He has been up and about interacting with peers and staff. He has attended groups. His self inventory: depression and hopelessness at 1, withdrawal of diarrhea, and he denies SI thoughts.

## 2014-02-13 NOTE — Progress Notes (Signed)
[facility] :  Will you be returning to the same living situation after discharge: No. [facility]  At discharge, do you have transportation home?:Yes,  bus[facility] Do you have the ability to pay for your medications:Yes,  [facility]  Release of information consent forms completed and submitted to Medical Records by CSW. Patient to Follow up at: Follow-up Information   [facility] ([facility] [address] by 7:30AM ([facility] between 7:30-8am). Screening and admission at 8am per [facility]. Make sure to bring ID ([place]), meds, and clothing. )    Contact information:   [address] [place], [place] [address] Phone: [phone] Fax: [phone]      [facility] (walk in between 8am-9am Monday through Friday for hospital follow-up/medication management/assessment for therapy services. )    Contact information:   [address] [place], [place] [address] Phone: [phone] Fax: [phone]      Patient denies SI/HI:   Yes,  during group/self report.     Safety Planning and Suicide Prevention discussed:  Yes,  SPE completed with pt's aunt. SPI pamphlet provided to pt and he was encouraged to share information with support network, ask questions, and talk about any concerns relating to SPE.  Smart, Carmell Elgin LCSWA  02/13/2014, 10:33 AM

## 2014-02-13 NOTE — Discharge Summary (Signed)
Physician Discharge Summary Note  Patient:  Clayton Howell is an 43 y.o., male MRN:  [id number] DOB:  27-Apr-1971 Patient phone:  [phone] (home)  Patient address:   [address] [place] [address],   Total Time spent with patient: Greater than 30 minutes  Date of Admission:  02/08/2014  Date of Discharge: 02/13/14  Reason for Admission:  Alcohol/drug detox  Discharge Diagnoses: Active Problems:   Substance induced mood disorder   Cocaine dependence   Alcohol abuse   Psychiatric Specialty Exam: Physical Exam  Psychiatric: His speech is normal and behavior is normal. Judgment and thought content normal. His mood appears not anxious. His affect is not angry, not blunt, not labile and not inappropriate. Cognition and memory are normal. He does not exhibit a depressed mood.    Review of Systems  Constitutional: Negative.   HENT: Negative.   Eyes: Negative.   Respiratory: Negative.   Cardiovascular: Negative.   Gastrointestinal: Negative.   Genitourinary: Negative.   Musculoskeletal: Negative.   Skin: Negative.   Neurological: Negative.   Endo/Heme/Allergies: Negative.   Psychiatric/Behavioral: Positive for depression (Stable) and substance abuse (Cocaine dependence). Negative for suicidal ideas, hallucinations and memory loss. The patient is not nervous/anxious and does not have insomnia.     Blood pressure 121/81, pulse 76, temperature 98.6 F (37 C), temperature source Oral, resp. rate 17, height 5\' 9"  (1.753 m), weight 74.39 kg (164 lb).Body mass index is 24.21 kg/(m^2).   General Appearance: Fairly Groomed   Patent attorney:: Fair   Speech: Clear and Coherent   Volume: Normal   Mood: Euthymic   Affect: Appropriate   Thought Process: Coherent and Goal Directed   Orientation: Full (Time, Place, and Person)   Thought Content: relapse prevention plan   Suicidal Thoughts: No   Homicidal Thoughts: No   Memory: Immediate; Fair  Recent; Fair  Remote; Fair    Judgement: Fair   Insight: Present   Psychomotor Activity: Normal   Concentration: Fair   Recall: [name] of Knowledge:NA   Language: Fair   Akathisia: No   Handed:   AIMS (if indicated):   Assets: Desire for Improvement   Sleep: Number of Hours: 6.25    Past Psychiatric History: Diagnosis: Alcohol Related Disorder - Moderate (303.90), Cocaine dependence  Hospitalizations: [facility] adult unit  Outpatient Care: [facility]  Substance Abuse Care: [facility]  Self-Mutilation: NA  Suicidal Attempts: NA  Violent Behaviors: NA   Musculoskeletal: Strength & Muscle Tone: within normal limits Gait & Station: normal Patient leans: N/A  DSM5: Schizophrenia Disorders:  NA Obsessive-Compulsive Disorders:  NA Trauma-Stressor Disorders:  NA Substance/Addictive Disorders:  Alcohol Related Disorder - Moderate (303.90), Cocaine dependence Depressive Disorders:  Substance induced mood disorder  Axis Diagnosis:  AXIS I:  Alcohol Related Disorder - Moderate (303.90), Cocaine dependence, Substance induced mood disorder AXIS II:  Deferred AXIS III:   Past Medical History  Diagnosis Date  . Crack cocaine use    AXIS IV:  Polysubstance dependence AXIS V:  62  Level of Care:  RTC  Hospital Course:  "Went to the ED because he was ready to give up on everything. Says he wanted to, but can't. One reason is, an unforgivable sin that he committed, secondly, hurting his family too much and too many times, all in the name of addiction to cocaine, marijuana and alcohol. Ran off from family, taken stuff out of the house, sold then for drug money. Have been binging on cocaine daily x  3 weeks. All he thinks about is smoking crack and Marijuana. Says been an addict x 20 years. 8 years of sobriety from 2005 thru 2013, then relapsed after Mama died. Says needs to be evaluated for mental illness.   Clayton Howell was admitted to the hospital with a blood alcohol level of 99 per toxicology reports and UDS  positive for Cocaine and THC. He was intoxicated requiring detoxifcation treatment. He was ordered and received Librium detox protocols. He was also medicated with Fluoxetine 20 mg daily for depression, Hydroxyzine 25 mg three times daily as needed for anxiety and Lamictal 25 mg daily for mood stabilization. Clayton Howell was also enrolled in group counseling sessions and AA/NA meetings being offered on this unit. He learned coping skills. He presented no other medical issues that required treatment and or monitoring. He tolerated his treatment regimen without any significant adverse effects and or reactions.  Clayton Howell has completed detox and his mood stabilized. He is currently being discharged to continue treatment at the [facility] treatment center in [place], [place]. And for medication management and routine psychiatric care, these services will be provided for Clayton Needle by the [place] [facility] here in [place], [place]. Upon discharge, he adamantly denies any SIHI, AVH, delusional thinking, paranoia and or withdrawal symptoms. He was provided with a 14 days worth, supply samples of his [facility] discharge medications. He left [facility] with all personal belongings in no distress. Transportation per [place] bus. Bus pass provided.  Consults:  psychiatry  Significant Diagnostic Studies:  labs: CBC with diff, CMP, UDS, toxicology tests, U/A  Discharge Vitals:   Blood pressure 121/81, pulse 76, temperature 98.6 F (37 C), temperature source Oral, resp. rate 17, height 5\' 9"  (1.753 m), weight 74.39 kg (164 lb). Body mass index is 24.21 kg/(m^2). Lab Results:   No results found for this or any previous visit (from the past 72 hour(s)).  Physical Findings: AIMS: Facial and Oral Movements Muscles of Facial Expression: None, normal Lips and Perioral Area: None, normal Jaw: None, normal Tongue: None, normal,Extremity Movements Upper (arms, wrists, hands, fingers): None, normal Lower (legs, knees, ankles, toes): None,  normal, Trunk Movements Neck, shoulders, hips: None, normal, Overall Severity Severity of abnormal movements (highest score from questions above): None, normal Incapacitation due to abnormal movements: None, normal Patient's awareness of abnormal movements (rate only patient's report): No Awareness, Dental Status Current problems with teeth and/or dentures?: No Does patient usually wear dentures?: No  CIWA:  CIWA-Ar Total: 0 COWS:     Psychiatric Specialty Exam: See Psychiatric Specialty Exam and Suicide Risk Assessment completed by Attending Physician prior to discharge.  Discharge destination:  [facility]  Is patient on multiple antipsychotic therapies at discharge:  No   Has Patient had three or more failed trials of antipsychotic monotherapy by history:  No  Recommended Plan for Multiple Antipsychotic Therapies: NA     Medication List       Indication   FLUoxetine 20 MG capsule  Commonly known as:  PROZAC  Take 1 capsule (20 mg total) by mouth daily. For depression   Indication:  Depression     hydrOXYzine 25 MG tablet  Commonly known as:  ATARAX/VISTARIL  Take 1 tablet (25 mg) three times daily as needed: For anxiety   Indication:  Tension, Anxiety     lamoTRIgine 25 MG tablet  Commonly known as:  LAMICTAL  Take 1 tablet (25 mg total) by mouth daily. For mood stabilization   Indication:  For mood stabilization  Follow-up Information   [facility] On 02/14/2014. ([facility] [address] by 7:30AM (pickup by [facility] between 7:30-8am). Screening and admission at 8[facility]. Make sure to bring ID ([place]), meds, and clothing. )    Contact information:   [address] [place], [place] [address] Phone: [phone] Fax: [phone]      [facility] (walk in between 8am-9am Monday through Friday for hospital follow-up/medication management/assessment for therapy services. )    Contact information:    [address] [place], [place] [address] Phone: [phone] Fax: [phone]     Follow-up recommendations: Activity:  As tolerated Diet: As recommended by your primary care doctor. Keep all scheduled follow-up appointments as recommended.   Comments:  Take all your medications as prescribed by your mental healthcare provider. Report any adverse effects and or reactions from your medicines to your outpatient provider promptly. Patient is instructed and cautioned to not engage in alcohol and or illegal drug use while on prescription medicines. In the event of worsening symptoms, patient is instructed to call the crisis hotline, 911 and or go to the nearest ED for appropriate evaluation and treatment of symptoms. Follow-up with your primary care provider for your other medical issues, concerns and or health care needs.   Total Discharge Time:  Greater than 30 minutes.  Signed: Sanjuana Kavawoko, Agnes I, PMHNp-BC 02/13/2014, 12:13 PM Personally evaluated the patient and agree with the assessment and plan Madie RenoIrving A. Dub MikesLugo, M.D.

## 2014-02-13 NOTE — [facility] Group Notes (Signed)
[facility] LCSW Group Therapy  02/13/2014 2:41 PM  Type of Therapy:  Group Therapy  Participation Level:  Minimal  Participation Quality:  Drowsy  Affect:  Lethargic  Cognitive:  Lacking  Insight:  Limited  Engagement in Therapy:  Limited  Modes of Intervention:  Discussion, Education, Exploration, Limit-setting, Problem-solving, Rapport Building, Socialization and Support  Summary of Progress/Problems: MHA Speaker came to talk about his personal journey with substance abuse and addiction. The pt processed ways by which to relate to the speaker. MHA speaker provided handouts and educational information pertaining to groups and services offered by the [facility]. Casimiro NeedleMichael was lethargic during today's group. He shows minimal progress in the group setting at this time due to drowsiness but did thank the speaker for coming when group concluded.    Smart, Lyllian Gause LCSWA  02/13/2014, 2:41 PM

## 2014-02-13 NOTE — Progress Notes (Signed)
[facility] MD Progress Note  02/13/2014 5:18 PM Clayton Howell  MRN:  [id number] Subjective:  Will go to [facility] in the morning. States that he is very hopeful that things are going to work out for him. States that he has not felt this good in a long time. Does not report any side effects to the medications.  Diagnosis:   DSM5: Schizophrenia Disorders:  none Obsessive-Compulsive Disorders:  none Trauma-Stressor Disorders:  none Substance/Addictive Disorders:  Alcohol use disorder moderat Cocaine related disorders severe Depressive Disorders:  Major Depressive Disorder - Moderate (296.22) Total Time spent with patient: 30 minutes  Axis I: Mood Disorder NOS  ADL's:  Intact  Sleep: Fair  Appetite:  Fair  Suicidal Ideation:  Plan:  denies Intent:  denies Means:  denies Homicidal Ideation:  Plan:  denies Intent:  denies Means:  denies AEB (as evidenced by):  Psychiatric Specialty Exam: Physical Exam  Review of Systems  Constitutional: Negative.   HENT: Negative.   Eyes: Negative.   Respiratory: Negative.   Cardiovascular: Negative.   Gastrointestinal: Negative.   Genitourinary: Negative.   Musculoskeletal: Negative.   Skin: Negative.   Neurological: Negative.   Endo/Heme/Allergies: Negative.   Psychiatric/Behavioral: Positive for substance abuse.    Blood pressure 121/81, pulse 76, temperature 98.6 F (37 C), temperature source Oral, resp. rate 17, height 5\' 9"  (1.753 m), weight 74.39 kg (164 lb).Body mass index is 24.21 kg/(m^2).  General Appearance: Fairly Groomed  Patent attorney::  Fair  Speech:  Clear and Coherent  Volume:  Normal  Mood:  worried  Affect:  Appropriate  Thought Process:  Coherent and Goal Directed  Orientation:  Full (Time, Place, and Person)  Thought Content:  worries, concerns  Suicidal Thoughts:  No  Homicidal Thoughts:  No  Memory:  Immediate;   Fair Recent;   Fair Remote;   Fair  Judgement:  Fair  Insight:  Present  Psychomotor Activity:   Restlessness  Concentration:  Fair  Recall:  [employer] of Knowledge:NA  Language: Fair  Akathisia:  No  Handed:    AIMS (if indicated):     Assets:  Desire for Improvement  Sleep:  Number of Hours: 6.25   Musculoskeletal: Strength & Muscle Tone: within normal limits Gait & Station: normal Patient leans: N/A  Current Medications: Current Facility-Administered Medications  Medication Dose Route Frequency Provider Last Rate Last Dose  . acetaminophen (TYLENOL) tablet 650 mg  650 mg Oral Q4H PRN Benjaman Pott, MD   650 mg at 02/10/14 2108  . alum & mag hydroxide-simeth (MAALOX/MYLANTA) 200-200-20 MG/5ML suspension 30 mL  30 mL Oral PRN Benjaman Pott, MD      . chlordiazePOXIDE (LIBRIUM) capsule 25 mg  25 mg Oral Once Beau Fanny, FNP      . feeding supplement (ENSURE COMPLETE) (ENSURE COMPLETE) liquid 237 mL  237 mL Oral BID BM Jeoffrey Massed, RD   237 mL at 02/13/14 1400  . FLUoxetine (PROZAC) capsule 20 mg  20 mg Oral Daily Benjaman Pott, MD   20 mg at 02/13/14 [id number]  . hydrOXYzine (ATARAX/VISTARIL) tablet 25 mg  25 mg Oral Q6H PRN Beau Fanny, FNP   25 mg at 02/11/14 2140  . lamoTRIgine (LAMICTAL) tablet 25 mg  25 mg Oral Daily Rachael Fee, MD   25 mg at 02/13/14 0800  . magnesium hydroxide (MILK OF MAGNESIA) suspension 30 mL  30 mL Oral Daily PRN Beau Fanny, FNP   30 mL  at 02/10/14 2109  . multivitamin with minerals tablet 1 tablet  1 tablet Oral Daily Beau FannyJohn C Withrow, FNP   1 tablet at 02/13/14 [phone]  . nicotine (NICODERM CQ - dosed in mg/24 hours) patch 21 mg  21 mg Transdermal Daily Rachael FeeIrving A Hadlee Burback, MD   21 mg at 02/13/14 [id number]  . ondansetron (ZOFRAN) tablet 4 mg  4 mg Oral Q8H PRN Benjaman PottGerald D Taylor, MD      . thiamine (B-1) injection 100 mg  100 mg Intramuscular Once Beau FannyJohn C Withrow, FNP      . thiamine (VITAMIN B-1) tablet 100 mg  100 mg Oral Daily Beau FannyJohn C Withrow, FNP   100 mg at 02/13/14 [id number]    Lab Results: No results found for this or any previous visit (from the  past 48 hour(s)).  Physical Findings: AIMS: Facial and Oral Movements Muscles of Facial Expression: None, normal Lips and Perioral Area: None, normal Jaw: None, normal Tongue: None, normal,Extremity Movements Upper (arms, wrists, hands, fingers): None, normal Lower (legs, knees, ankles, toes): None, normal, Trunk Movements Neck, shoulders, hips: None, normal, Overall Severity Severity of abnormal movements (highest score from questions above): None, normal Incapacitation due to abnormal movements: None, normal Patient's awareness of abnormal movements (rate only patient's report): No Awareness, Dental Status Current problems with teeth and/or dentures?: No Does patient usually wear dentures?: No  CIWA:  CIWA-Ar Total: 0 COWS:     Treatment Plan Summary: Daily contact with patient to assess and evaluate symptoms and progress in treatment Medication management  Plan: Supportive approach/coping skills/relapse prevention           Will defer an increase in Lamictal to the [facility] staff           Will [facility]  Medical Decision Making Problem Points:  Review of psycho-social stressors (1) Data Points:  Review of medication regiment & side effects (2)  I certify that inpatient services furnished can reasonably be expected to improve the patient's condition.   Kynnedy Carreno A 02/13/2014, 5:18 PM

## 2014-02-13 NOTE — Progress Notes (Signed)
Adult Psychoeducational Group Note  Date:  02/13/2014 Time:  10:00 am  Group Topic/Focus:  Recovery Goals:   The focus of this group is to identify appropriate goals for recovery and establish a plan to achieve them.  Participation Level:  Active  Participation Quality:  Appropriate, Sharing and Supportive  Affect:  Appropriate  Cognitive:  Appropriate  Insight: Appropriate  Engagement in Group:  Engaged  Modes of Intervention:  Discussion, Education, Socialization and Support  Additional Comments:  Pt stated that [facility] and remaining clean and sober for 90 days are his short term goals. Pt identified his long term goals as finding stable employment and recouping for his losses.   Gurbani Figge 02/13/2014, 11:39 AM

## 2014-02-13 NOTE — Progress Notes (Signed)
Recreation Therapy Notes  Animal-Assisted Activity/Therapy (AAA/T) Program Checklist/Progress Notes Patient Eligibility Criteria Checklist & Daily Group note for Rec Tx Intervention  Date: 04.07.2015 Time: 2:45pm [address]   AAA/T Program Assumption of Risk Form signed by Patient/ or Parent Legal Guardian yes  Patient is free of allergies or sever asthma yes  Patient reports no fear of animals yes  Patient reports no history of cruelty to animals yes   Patient understands his/her participation is voluntary yes  Patient washes hands before animal contact yes  Patient washes hands after animal contact yes  Behavioral Response: Engaged, Appropriate   Education: Hand Washing, Appropriate Animal Interaction   Education Outcome: Acknowledges understanding  Clinical Observations/Feedback: Patient pet therapy dog and interacted with peers appropriately.   Marykay Lexenise L Haely Leyland, LRT/CTRS  Jearl KlinefelterBlanchfield, Franklin Cl[place]addle L 02/13/2014 4:42 PM

## 2014-02-13 NOTE — Progress Notes (Signed)
Patient ID: Clayton GriffesMichael S Howell, male   DOB: 10/07/1971, 43 y.o.   MRN: [id number]  D: Pt was pleasant and animated. On initial introduction pt informed the writer of the whereabouts of all her pt's during roll call. Pt later came to the window requesting meds. Pt stated that he "wasn't used to the routine' and didn't know if he was supposed to come for meds. Writer informed pt that during scheduled meds his nurse would call him to the window. However, if there was something he needed his nurse would address it with him at that time. Pt acknowledged understanding.  A:  Support and encouragement was offered. 15 min checks continued for safety.  R: Pt remains safe.

## 2014-02-13 NOTE — [facility] Group Notes (Signed)
Adult Psychoeducational Group Note  Date:  02/13/2014 Time:  9:40 PM  Group Topic/Focus:  Wrap-Up Group:   The focus of this group is to help patients review their daily goal of treatment and discuss progress on daily workbooks.  Participation Level:  Active  Participation Quality:  Appropriate  Affect:  Appropriate  Cognitive:  Appropriate  Insight: Appropriate  Engagement in Group:  Engaged  Modes of Intervention:  Discussion  Additional Comments:  Kathlene NovemberMike said he will [facility].  He also stated that his kids visited him and they "still love me and want me to get help".  He expressed having a renewed hope in life after seeing his kids.  Caroll RancherLindsay, Joao Mccurdy A 02/13/2014, 9:40 PM

## 2014-02-13 NOTE — [facility] Group Notes (Signed)
Was very open and eager to share his feelings with the group. He wants to keep his faith up and deal with his stressors in life. It was observed that he is really trying to get better and was happy to leave his son a voicemail this morning. Goal: Be the man/father he was meant to be.

## 2014-02-13 NOTE — [facility] Suicide Risk Assessment (Signed)
Suicide Risk Assessment  Discharge Assessment     Demographic Factors:  Male and Caucasian  Total Time spent with patient: 45 minutes  Psychiatric Specialty Exam:     Blood pressure 121/81, pulse 76, temperature 98.6 F (37 C), temperature source Oral, resp. rate 17, height 5\' 9"  (1.753 m), weight 74.39 kg (164 lb).Body mass index is 24.21 kg/(m^2).  General Appearance: Fairly Groomed  Patent attorneyye Contact::  Fair  Speech:  Clear and Coherent  Volume:  Normal  Mood:  Euthymic  Affect:  Appropriate  Thought Process:  Coherent and Goal Directed  Orientation:  Full (Time, Place, and Person)  Thought Content:  relapse prevention plan  Suicidal Thoughts:  No  Homicidal Thoughts:  No  Memory:  Immediate;   Fair Recent;   Fair Remote;   Fair  Judgement:  Fair  Insight:  Present  Psychomotor Activity:  Normal  Concentration:  Fair  Recall:  [employer]  Fund of Knowledge:NA  Language: Fair  Akathisia:  No  Handed:    AIMS (if indicated):     Assets:  Desire for Improvement  Sleep:  Number of Hours: 6.25    Musculoskeletal: Strength & Muscle Tone: within normal limits Gait & Station: normal Patient leans: N/A   Mental Status Per Nursing Assessment::   On Admission:  Suicidal ideation indicated by patient  Current Mental Status by Physician: In full contact with reality. No active S/S of withdrawal   Loss Factors: Financial problems/change in socioeconomic status  Historical Factors: NA  Risk Reduction Factors:   Sense of responsibility to family  Continued Clinical Symptoms:  Depression:   Comorbid alcohol abuse/dependence Impulsivity Alcohol/Substance Abuse/Dependencies  Cognitive Features That Contribute To Risk:  Closed-mindedness Polarized thinking Thought constriction (tunnel vision)    Suicide Risk:  Minimal: No identifiable suicidal ideation.  Patients presenting with no risk factors but with morbid ruminations; may be classified as minimal risk based on the  severity of the depressive symptoms  Discharge Diagnoses:   AXIS I:  Cocaine Dependence, Alcohol Abuse, Mood Disorder NOS AXIS II:  No diagnosis AXIS III:   Past Medical History  Diagnosis Date  . Crack cocaine use    AXIS IV:  other psychosocial or environmental problems AXIS V:  61-70 mild symptoms  Plan Of Care/Follow-up recommendations:  Activity:  as tolerated Diet:  regular Follow up [facility] Is patient on multiple antipsychotic therapies at discharge:  No   Has Patient had three or more failed trials of antipsychotic monotherapy by history:  No  Recommended Plan for Multiple Antipsychotic Therapies: NA    Clayton Howell A 02/13/2014, 5:30 PM

## 2014-02-14 NOTE — Progress Notes (Signed)
Pt discharged from unit. Pt received discharge instructions from previous shift RN. Pt verbalized understanding of instructions. All belongings returned. Pt escorted to waiting area and will take the bus to his destination.

## 2014-02-19 NOTE — Progress Notes (Signed)
Patient Discharge Instructions:  After Visit Summary (AVS):   Faxed to:  02/19/14 Discharge Summary Note:   Faxed to:  02/19/14 Psychiatric Admission Assessment Note:   Faxed to:  02/19/14 Suicide Risk Assessment - Discharge Assessment:   Faxed to:  02/19/14 Faxed/Sent to the Next Level Care provider:  02/19/14 [facility] @ [phone] [facility] to [facility] @ [phone] Jerelene ReddenSheena E [place], 02/19/2014, 4:31 PM

## 2014-10-05 ENCOUNTER — Emergency Department (HOSPITAL_COMMUNITY): Payer: Medicaid Other

## 2014-10-05 ENCOUNTER — Encounter (HOSPITAL_COMMUNITY): Payer: Self-pay | Admitting: Emergency Medicine

## 2014-10-05 ENCOUNTER — Emergency Department (HOSPITAL_COMMUNITY)
Admission: EM | Admit: 2014-10-05 | Discharge: 2014-10-05 | Disposition: A | Discharge status: Home / Self Care | Payer: Medicaid Other | Attending: Emergency Medicine | Admitting: Emergency Medicine

## 2014-10-05 DIAGNOSIS — M5412 Radiculopathy, cervical region: Secondary | ICD-10-CM

## 2014-10-05 DIAGNOSIS — M62830 Muscle spasm of back: Secondary | ICD-10-CM

## 2014-10-05 DIAGNOSIS — Z79899 Other long term (current) drug therapy: Secondary | ICD-10-CM | POA: Diagnosis not present

## 2014-10-05 DIAGNOSIS — Z720 Tobacco use: Secondary | ICD-10-CM | POA: Diagnosis not present

## 2014-10-05 DIAGNOSIS — M25512 Pain in left shoulder: Secondary | ICD-10-CM | POA: Diagnosis present

## 2014-10-05 DIAGNOSIS — M5410 Radiculopathy, site unspecified: Secondary | ICD-10-CM

## 2014-10-05 LAB — I-STAT TROPONIN, ED: Troponin i, poc: 0 ng/mL (ref 0.00–0.08)

## 2014-10-05 MED ORDER — KETOROLAC TROMETHAMINE 60 MG/2ML IM SOLN
30.0000 mg | Freq: Once | INTRAMUSCULAR | Status: AC
  Administered 2014-10-05: 30 mg via INTRAMUSCULAR
  Filled 2014-10-05: qty 2

## 2014-10-05 MED ORDER — TRAMADOL HCL 50 MG PO TABS: 50.0000 mg | ORAL_TABLET | Freq: Four times a day (QID) | ORAL | Status: AC | PRN

## 2014-10-05 MED ORDER — METHOCARBAMOL 500 MG PO TABS: 1000.0000 mg | ORAL_TABLET | Freq: Four times a day (QID) | ORAL | Status: DC | PRN

## 2014-10-05 MED ORDER — METHOCARBAMOL 500 MG PO TABS
1000.0000 mg | ORAL_TABLET | Freq: Once | ORAL | Status: AC
Start: 2014-10-05 — End: 2014-10-05
  Administered 2014-10-05: 1000 mg via ORAL
  Filled 2014-10-05: qty 2

## 2014-10-05 MED ORDER — ASPIRIN EC 325 MG PO TBEC
325.0000 mg | DELAYED_RELEASE_TABLET | Freq: Once | ORAL | Status: DC
  Filled 2014-10-05: qty 1

## 2014-10-05 MED ORDER — ASPIRIN EC 81 MG PO TBEC: 81.0000 mg | DELAYED_RELEASE_TABLET | Freq: Once | ORAL | Status: DC

## 2014-10-05 MED ORDER — ASPIRIN 81 MG PO CHEW
CHEWABLE_TABLET | ORAL | Status: AC
  Administered 2014-10-05: 324 mg
  Filled 2014-10-05: qty 4

## 2014-10-05 NOTE — Discharge Instructions (Signed)
For pain control you may take:  800mg  of ibuprofen (that is usually 4 over the counter pills)  3 times a day (take with food) and acetaminophen 975mg  (this is 3 over the counter pills) four times a day. Do not drink alcohol or combine with other medications that have acetaminophen as an ingredient (Read the labels!).  For breakthrough pain you may take Tramadol. Do not drink alcohol drive or operate heavy machinery when taking Tramadol.  Do not hesitate to return to the emergency room for any new, worsening or concerning symptoms.  Please obtain primary care using resource guide below. But the minute you were seen in the emergency room and that they will need to obtain records for further outpatient management.   Emergency Department Resource Guide 1) Find a Doctor and Pay Out of Pocket Although you won't have to find out who is covered by your insurance plan, it is a good idea to ask around and get recommendations. You will then need to call the office and see if the doctor you have chosen will accept you as a new patient and what types of options they offer for patients who are self-pay. Some doctors offer discounts or will set up payment plans for their patients who do not have insurance, but you will need to ask so you aren't surprised when you get to your appointment.  2) Contact Your Local Health Department Not all health departments have doctors that can see patients for sick visits, but many do, so it is worth a call to see if yours does. If you don't know where your local health department is, you can check in your phone book. The CDC also has a tool to help you locate your state's health department, and many state websites also have listings of all of their local health departments.  3) Find a Walk-in Clinic If your illness is not likely to be very severe or complicated, you may want to try a walk in clinic. These are popping up all over the country in pharmacies, drugstores, and shopping  centers. They're usually staffed by nurse practitioners or physician assistants that have been trained to treat common illnesses and complaints. They're usually fairly quick and inexpensive. However, if you have serious medical issues or chronic medical problems, these are probably not your best option.  No Primary Care Doctor: - Call Health Connect at  [phone] - they can help you locate a primary care doctor that  accepts your insurance, provides certain services, etc. - Physician Referral Service- [phone]  Chronic Pain Problems: Organization         Address  Phone   Notes  [facility]  [phone] Patients need to be referred by their primary care doctor.   Medication Assistance: Organization         Address  Phone   Notes  [facility] [address] [place], [place] [id number] [phone] --Must be a resident of [place] -- Must have NO insurance coverage whatsoever (no Medicaid/ Medicare, etc.) -- The pt. MUST have a primary care doctor that directs their care regularly and follows them in the community   Med[facility]ist  [phone]   [employer]  [phone]    Agencies that provide inexpensive medical care: Organization         Address  Phone   Notes  [facility]  [phone]   [facility]    (  [phone]   [facility] [place], [place] [address] [phone]   [place] [address] [place] [phone]   Planned Parenthood    [phone]   [facility]    [phone]   [place] and [address], [place] Phone:  [phone], Fax:  [phone] Hours of Operation:  9 am - 6 pm, M-F.  Also accepts Medicaid/Medicare and self-pay.  [facility] for [place], [address], [place] Phone: [phone], Fax: [phone]. Hours of Operation:  8:30 am - 5:30 pm, M-F.  Also accepts Medicaid and self-pay.  [facility] [place] [address], [place] Phone: [phone]   [place], [place], [place] [phone], Ext. [phone] Mondays & Thursdays: 7-9 AM.  First 15 patients are seen on a first come, first serve basis.    [place] Providers:  Organization         Address  Phone   Notes  [facility] [address], [place] [phone] Also accepts self-pay patients.  [facility] [id number] [place], [place]  [phone]   [place], [address], [place] [phone]   [facility] [address], [place] [phone]   Lucianne Lei [address], [place]   [phone] Only accepts [place] Access [place] patients after they have their name applied to their card.   Self-Pay (no insurance) in [facility]:  Organization         Address  Phone   Notes  Sickle Cell Patients, [facility] Magazine [phone]   [facility] [place] [phone]   [facility] [place]  [place], [place], [place] [phone]   [facility]/Dr. Osei-Bonsu  [address] [place] or [address], [place] [phone] Phone number for both [place] and [place] locations is the same.  Urgent [facility] and [facility] [address], [place] [phone]   [facility] [address] [place] or [address] [phone]   [facility] [address], [place] [phone], phone; [phone], fax Sees patients 1st and 3rd Saturday of every month.  Must not qualify for public or private insurance (i.e.  Medicaid, Medicare, [place] Health Choice, Veterans' Benefits)  Household income should be no more than 200% of the poverty level The clinic cannot treat you if you are pregnant or think you are pregnant  Sexually transmitted diseases are not treated at the clinic.    Dental Care: Organization         Address  Phone  Notes  [facility] of [facility] [place] [phone] Accepts children up to age 54 who are enrolled in [place] or [place]; pregnant women with a Medicaid card; and children who have applied for Medicaid or  Health Choice, but were declined, whose parents can pay a reduced fee at time of service.  [facility] Department of [facility]  [address], [place] [phone] Accepts children up  to age 84 who are enrolled in Medicaid or [place] Health Choice; pregnant women with a Medicaid card; and children who have applied for Medicaid or [place] Health Choice, but were declined, whose parents can pay a reduced fee at time of service.  [place] Adult [facility] Access PROGRAM  Fellows [phone] Patients are seen by appointment only. Walk-ins are not accepted. [place] will see patients 9 years of age and older. Monday - Tuesday (8am-5pm) Most Wednesdays (8:30-5pm) $30 per visit, cash only  [facility] Adult [facility] Access PROGRAM  [address], [facility] [phone] Patients are seen by appointment only. Walk-ins are not accepted. [place] will see patients 65 years of age and older. One Wednesday Evening (Monthly: Volunteer Based).  $30 per visit, cash only  [place]  [phone] for adults; Children under age 30, call Graduate Pediatric Dentistry at [phone]. Children aged 55-14, please call [phone] to request a pediatric application.  Dental services are provided in all areas of dental care including fillings,  crowns and bridges, complete and partial dentures, implants, gum treatment, root canals, and extractions. Preventive care is also provided. Treatment is provided to both adults and children. Patients are selected via a lottery and there is often a waiting list.   [facility] [address] [place]  [phone] [email or web address]   [facility] [address] [place], [place] [phone], Ext. [phone] Second and Fourth Thursday of each month, opens at 6:30 AM; Clinic ends at 9 AM.  Patients are seen on a first-come first-served basis, and a limited number are seen during each clinic.   [facility]  [address]er [place], [place] [phone]   Eligibility Requirements You must have lived in [place], [place], or [place] counties for at least the last three months.   You cannot be eligible for state or federal sponsored [employer], including [employer], [place], or [employer].   You generally cannot be eligible for healthcare insurance through your employer.    How to apply: Eligibility screenings are held every Tuesday and Wednesday afternoon from 1:00 pm until 4:00 pm. You do not need an appointment for the interview!  [facility] [address] [place], [place]   [place]  Brecon Department  [place]  [phone]    Behavioral Health Resources in the Community: Intensive Outpatient Programs Organization         Address  Phone  Notes  [facility] [address] [place], [place] [phone]   [facility] [address], [place], [place]   ADS: Alcohol & Drug Svcs [address], [place], [place]   [place] [address]  [place], [place] or [phone]   Substance Abuse  Resources Organization         Address  Phone  Notes  Alcohol and Drug Services  [phone]   [place]  [phone]   [facility] [place]   Chinita Pester  [phone]   Residential & Outpatient Substance Abuse Program  [phone]   Psychological Services Organization         Address  Phone  Notes  [place]  [place]  [place]   [place]  [address] [place] or [phone]    Mobile Crisis Teams Organization         Address  Phone  Notes  [facility], Mobile Crisis Care Unit  [phone]   [facility]  [address] [place], [place]   [place] [address] [place] [phone]    Self-Help/Support Groups Organization         Address  Phone             Notes  [place] of [place] - variety of support groups  [place] Call for more information  Narcotics Anonymous (NA), [facility] [address], [employer] [place]  2 meetings at this location   Special educational needs teacher         Address  Phone  Notes  [facility] [place],    [place]  [phone]   [facility]  [address], [place] [phone], [place], [place]   [place], [place] [phone] Admissions: 8am-3pm M-F  Incentives Substance [place] [address],    [place], [place] [phone]   [facility] [address] [place], [place], [place]   [facility] [address]  [place], [place]   [facility] - Intensive Outpatient [address], [place], [place]   [facility] [place].,  [place], [place] [phone] or [phone]   [facility] (RTS) [address] [place], [place] Accepts Medicaid  [facility] [place] [address]  [place] [phone] Substance Abuse/Addiction Treatment   [facility] Organization         Address  Phone  Notes  [facility]  [phone]   Domenic Schwab, PhD [address] Arlis Porta [place], [place]   [phone] or [phone]   [facility] [place], [place] [phone]   [facility] [address] [place], [place] [phone] Insurance/Medicaid/sponsorship through [facility] and Families [address] [place]                                    [place], [place] [phone] [place] [address], [place] [phone]    Dr. Adele Schilder  [phone]   [facility] Dept. 1) [address] [place] 2) [place] 3)  [place] [address], [place] [phone]  [phone]   [place] [phone] or [phone] (After Hours)

## 2014-10-05 NOTE — ED Notes (Signed)
Pt reports shoulder pain that began 2 weeks ago on humoral head area then spread up back of shoulder blade to neck and down arm causing thumb numbness. No chest pain. Denies any acute injury, heavy lifting or bending.

## 2014-10-05 NOTE — ED Provider Notes (Signed)
CSN: [id number]     Arrival date & time 10/05/14  1721 History  This chart was scribed for Wynetta EmeryNicole Avari Nevares, PA-C, working with Tilden FossaElizabeth Rees, MD found by Elon SpannerGarrett Cook, ED Scribe. This patient was seen in room WTR8/WTR8 and the patient's care was started at 5:53 PM.   Chief Complaint  Patient presents with  . Shoulder Pain   The history is provided by the patient. No language interpreter was used.   HPI Comments: Clayton Howell is a 43 y.o. male who presents to the Emergency Department complaining of 8/10 left shoulder pain onset 2 weeks ago without fall/injury.  Patient reports that soon after onset the pain began to radiate into his neck and down his arm.  Feels a numbness in the left thumb. Patient denies aggravating factors.  Patient reports a warm bath helps alleviate the pain.  Patient reports using a heating pad without relief.  Patient reports he took 1 dose of an unknown muscle relaxer without relief.  NKA.  Past Medical History  Diagnosis Date  . Crack cocaine use    Past Surgical History  Procedure Laterality Date  . Leg surgery     History reviewed. No pertinent family history. History  Substance Use Topics  . Smoking status: Current Every Day Smoker    Types: Cigarettes  . Smokeless tobacco: Not on file  . Alcohol Use: No    Review of Systems A complete 10 system review of systems was obtained and all systems are negative except as noted in the HPI and PMH.   Allergies  Review of patient's allergies indicates no known allergies.  Home Medications   Prior to Admission medications   Medication Sig Start Date End Date Taking? Authorizing Provider  FLUoxetine (PROZAC) 20 MG capsule Take 1 capsule (20 mg total) by mouth daily. For depression 02/13/14   Sanjuana KavaAgnes I Nwoko, NP  hydrOXYzine (ATARAX/VISTARIL) 25 MG tablet Take 1 tablet (25 mg) three times daily as needed: For anxiety 02/13/14   Sanjuana KavaAgnes I Nwoko, NP  lamoTRIgine (LAMICTAL) 25 MG tablet Take 1 tablet (25 mg total) by  mouth daily. For mood stabilization 02/13/14   Sanjuana KavaAgnes I Nwoko, NP  traMADol (ULTRAM) 50 MG tablet Take 1 tablet (50 mg total) by mouth every 6 (six) hours as needed. 10/05/14   Thurmon Mizell, PA-C   BP 122/78 mmHg  Pulse 88  Temp(Src) 97.9 F (36.6 C) (Oral)  Resp 16  SpO2 99% Physical Exam  Constitutional: He is oriented to person, place, and time. He appears well-developed and well-nourished. No distress.  HENT:  Head: Normocephalic and atraumatic.  Mouth/Throat: Oropharynx is clear and moist.  Eyes: Conjunctivae and EOM are normal. Pupils are equal, round, and reactive to light.  Neck: Normal range of motion.  No midline C-spine  tenderness to palpation or step-offs appreciated. Patient has full range of motion without pain.   Cardiovascular: Normal rate, regular rhythm and intact distal pulses.   Pulmonary/Chest: Effort normal and breath sounds normal. No stridor. No respiratory distress. He has no wheezes. He has no rales. He exhibits no tenderness.  Abdominal: Soft. Bowel sounds are normal. He exhibits no distension and no mass. There is no tenderness. There is no rebound and no guarding.  Musculoskeletal: Normal range of motion.  Left shoulder with no deformity, he is able to abduct greater than 90. Drop arm is negative. Neurovascularly intact. Grip strength is equal bilaterally, biceps and triceps strengths are also equal at 5 out of 5. He is  able to differentiate pinprick from light touch.  Neurological: He is alert and oriented to person, place, and time.  Psychiatric: He has a normal mood and affect.  Nursing note and vitals reviewed.   ED Course  Procedures (including critical care time)  DIAGNOSTIC STUDIES: Oxygen Saturation is 99% on RA, normal by my interpretation.    COORDINATION OF CARE:  5:58 PM Informed patient of suspicion of muscle spasm.  Will order muscle relaxant.  Advised patient to take Motrin as needed.  Advised patient to continue to use heat pad and  soak the area.    Labs Review Labs Reviewed  Rosezena Sensor, ED    Imaging Review Dg Chest 2 View  10/05/2014   CLINICAL DATA:  Left chest pain  EXAM: CHEST  2 VIEW  COMPARISON:  None.  FINDINGS: Lungs are clear.  No pleural effusion or pneumothorax.  The heart is normal in size.  Visualized osseous structures are within normal limits.  IMPRESSION: Normal chest radiographs.   Electronically Signed   By: Charline Bills M.D.   On: 10/05/2014 20:01   Dg Cervical Spine Complete  10/05/2014   CLINICAL DATA:  Left-sided radicular pain.  EXAM: CERVICAL SPINE  4+ VIEWS  COMPARISON:  None.  FINDINGS: There is no evidence of cervical spine fracture or prevertebral soft tissue swelling. No spondylolisthesis is noted. Moderate neural foraminal stenosis is noted on the left at C6-7 secondary to uncovertebral spurring. Posterior facet joints appear normal. Mild degenerative disc disease is noted at C4-5 and C5-6.  IMPRESSION: Mild degenerative disc disease is noted at C4-5 and C5-6. Moderate neural foraminal stenosis is noted on the left at C6-7 secondary to uncovertebral spurring.   Electronically Signed   By: Roque Lias M.D.   On: 10/05/2014 19:55     EKG Interpretation   Date/Time:  Friday October 05 2014 19:11:11 EST Ventricular Rate:  71 PR Interval:  142 QRS Duration: 86 QT Interval:  409 QTC Calculation: 444 R Axis:   111 Text Interpretation:  Sinus rhythm Left posterior fascicular block  Confirmed by Lincoln Brigham [phone]) on 10/06/2014 1:58:19 AM      MDM   Final diagnoses:  Radicular pain  Cervical radiculopathy    Filed Vitals:   10/05/14 1736 10/05/14 2039  BP: 132/90 122/78  Pulse: 90 88  Temp: 97.9 F (36.6 C)   TempSrc: Oral   Resp: 18 16  SpO2: 99% 99%    Medications  methocarbamol (ROBAXIN) tablet 1,000 mg (1,000 mg Oral Given 10/05/14 1829)  ketorolac (TORADOL) injection 30 mg (30 mg Intramuscular Given 10/05/14 1830)  aspirin 81 MG chewable tablet (324 mg   Given 10/05/14 1840)    Clayton Howell is a 43 y.o. male presenting with atraumatic left shoulder pain in the scapular area radiating down the arm and causing a paresthesia in the thumb. Patient has history of crack cocaine abuse, I think it is unlikely cardiac workup was initiated which shows no acute abnormalities. C-spine films show degenerative disc disease and moderate neuralstenosis on the left C6 area from spurring. Results discussed with patient and he is given neurosurgery referral. Neuro exam is nonfocal, no indication for emergent neurosurgery intervention at this time.  This is a shared visit with the attending physician who personally evaluated the patient and agrees with the care plan.   Evaluation does not show pathology that would require ongoing emergent intervention or inpatient treatment. Pt is hemodynamically stable and mentating appropriately. Discussed findings and plan with  patient/guardian, who agrees with care plan. All questions answered. Return precautions discussed and outpatient follow up given.   Discharge Medication List as of 10/05/2014  8:26 PM    START taking these medications   Details  traMADol (ULTRAM) 50 MG tablet Take 1 tablet (50 mg total) by mouth every 6 (six) hours as needed., Starting 10/05/2014, Until Discontinued, Print         I personally performed the services described in this documentation, which was scribed in my presence. The recorded information has been reviewed and is accurate.    Wynetta Emeryicole Cabrini Ruggieri, PA-C 10/06/14 1040  Tilden FossaElizabeth Rees, MD 10/06/14 1451

## 2015-08-04 IMAGING — CR DG CERVICAL SPINE COMPLETE 4+V
6 series · 6 of 6 positions shown · non-contrast
Comparison: None.

CLINICAL DATA: Left-sided radicular pain.

EXAM:
CERVICAL SPINE  4+ VIEWS

[w cervical spine lat]
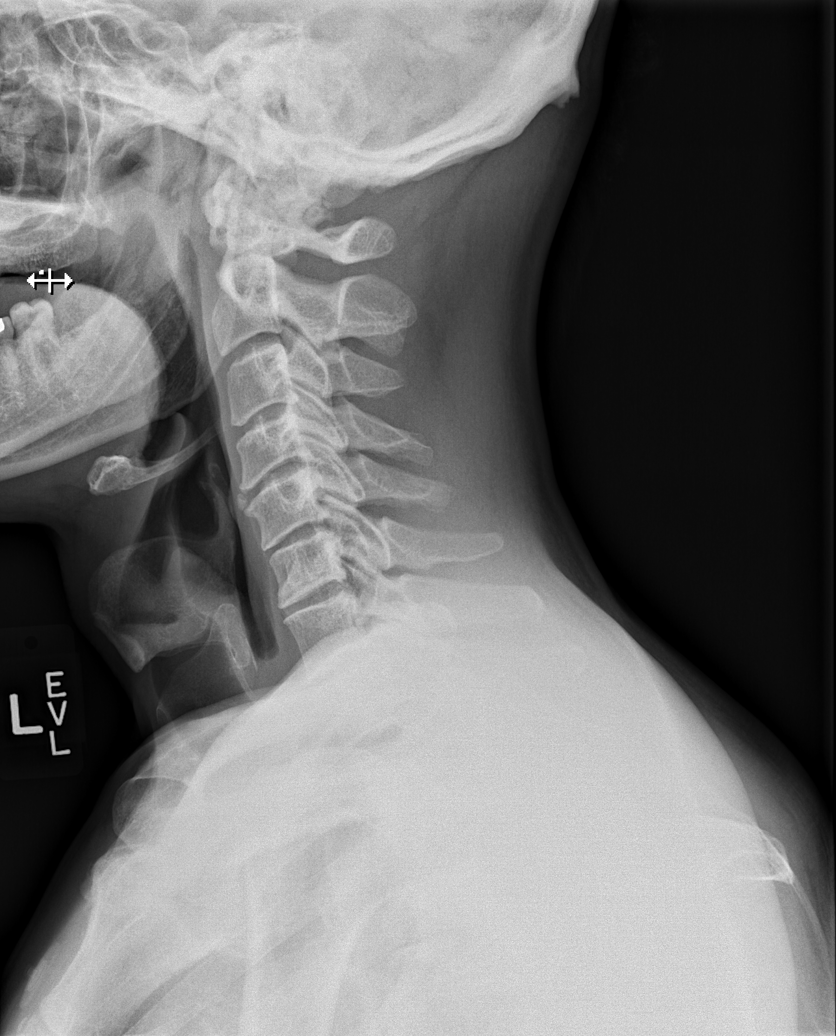

[w cervical spine ap_obl (1 of 2)]
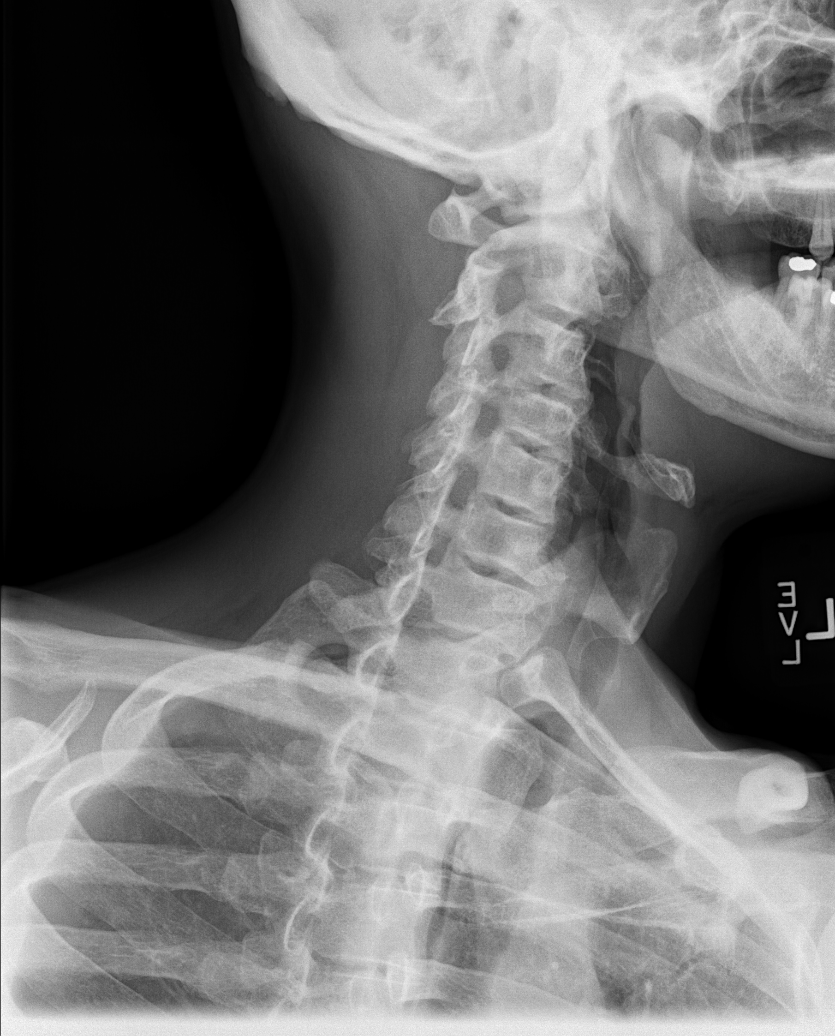

[w cervical spine ap_obl (2 of 2)]
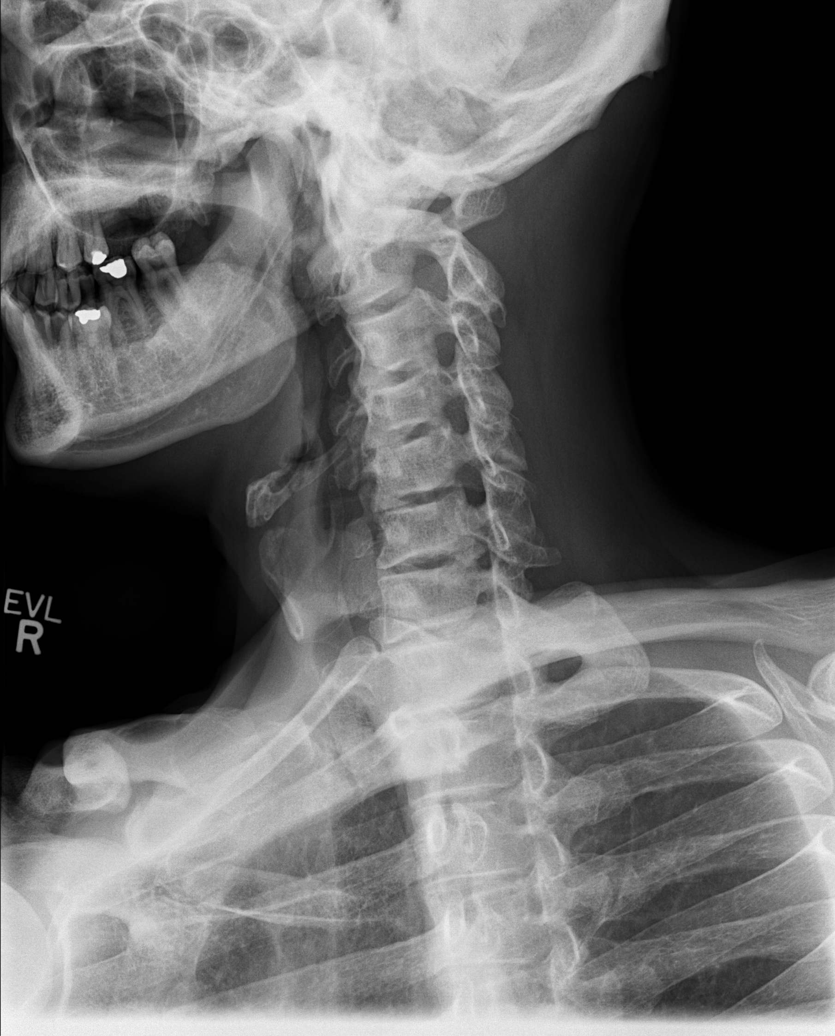

[w cervical spine ap]
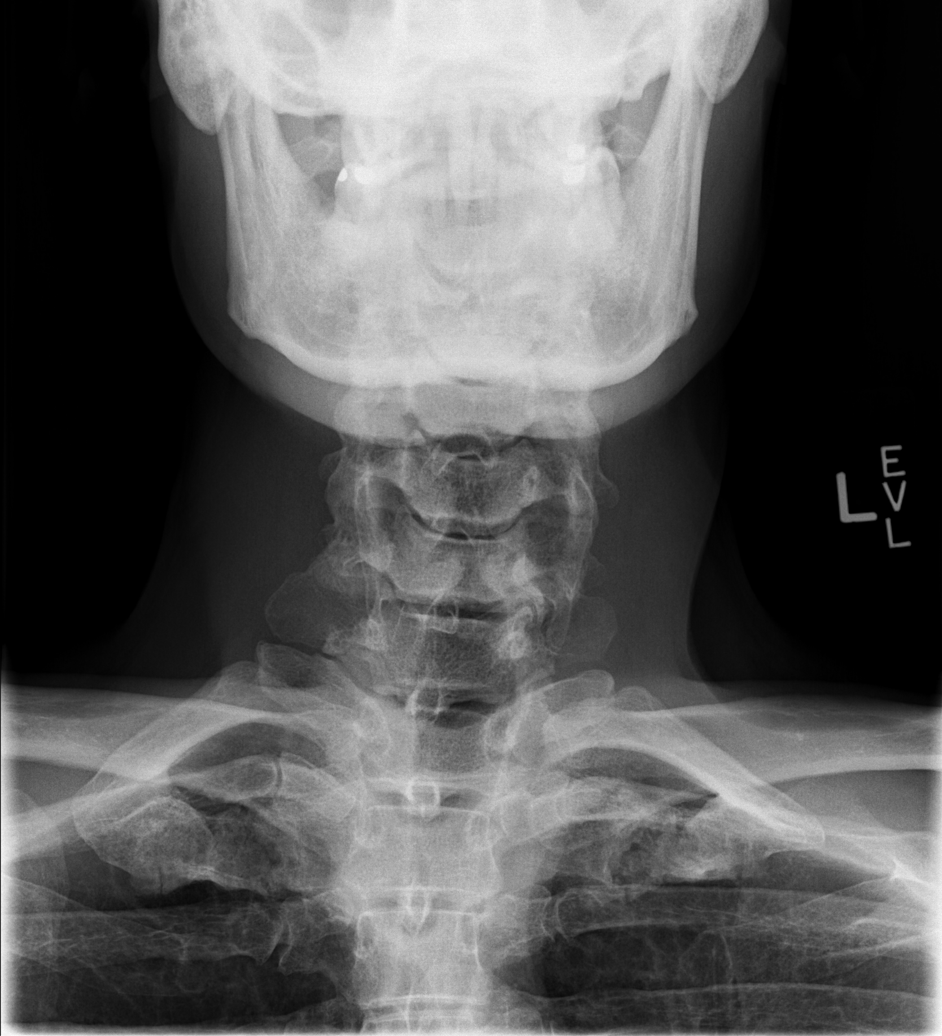

[w cervical spine odontoid (1 of 2)]
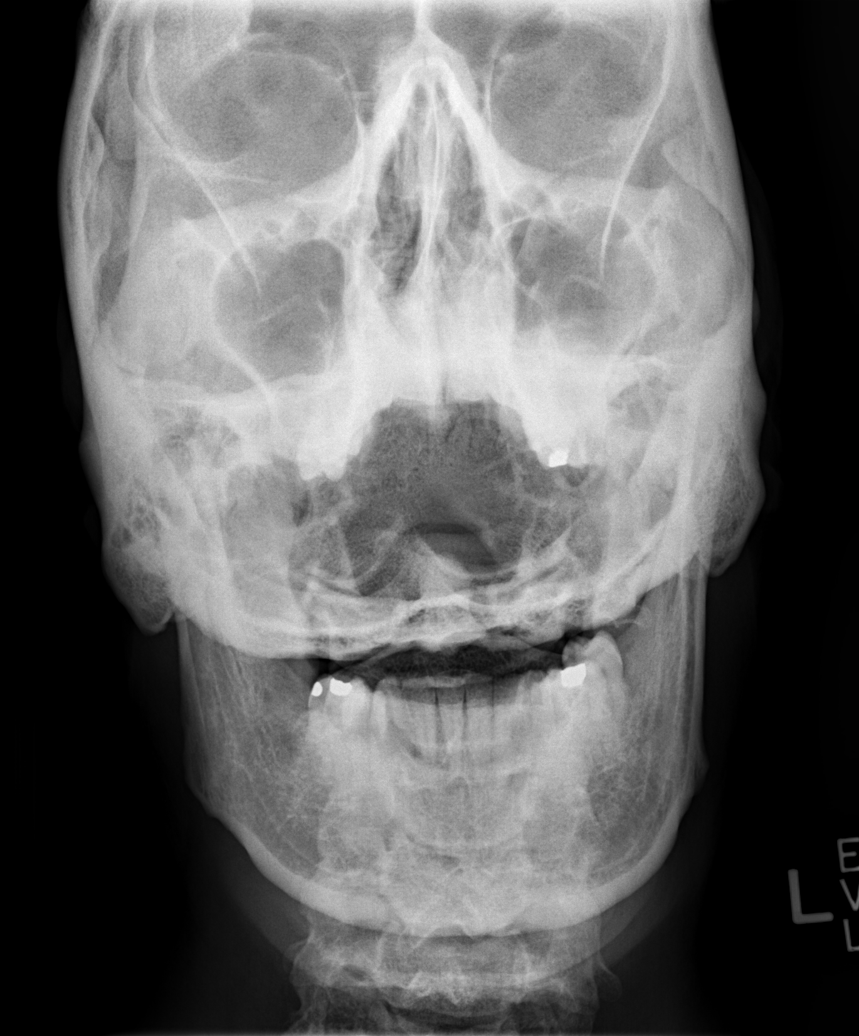

[w cervical spine odontoid (2 of 2)]
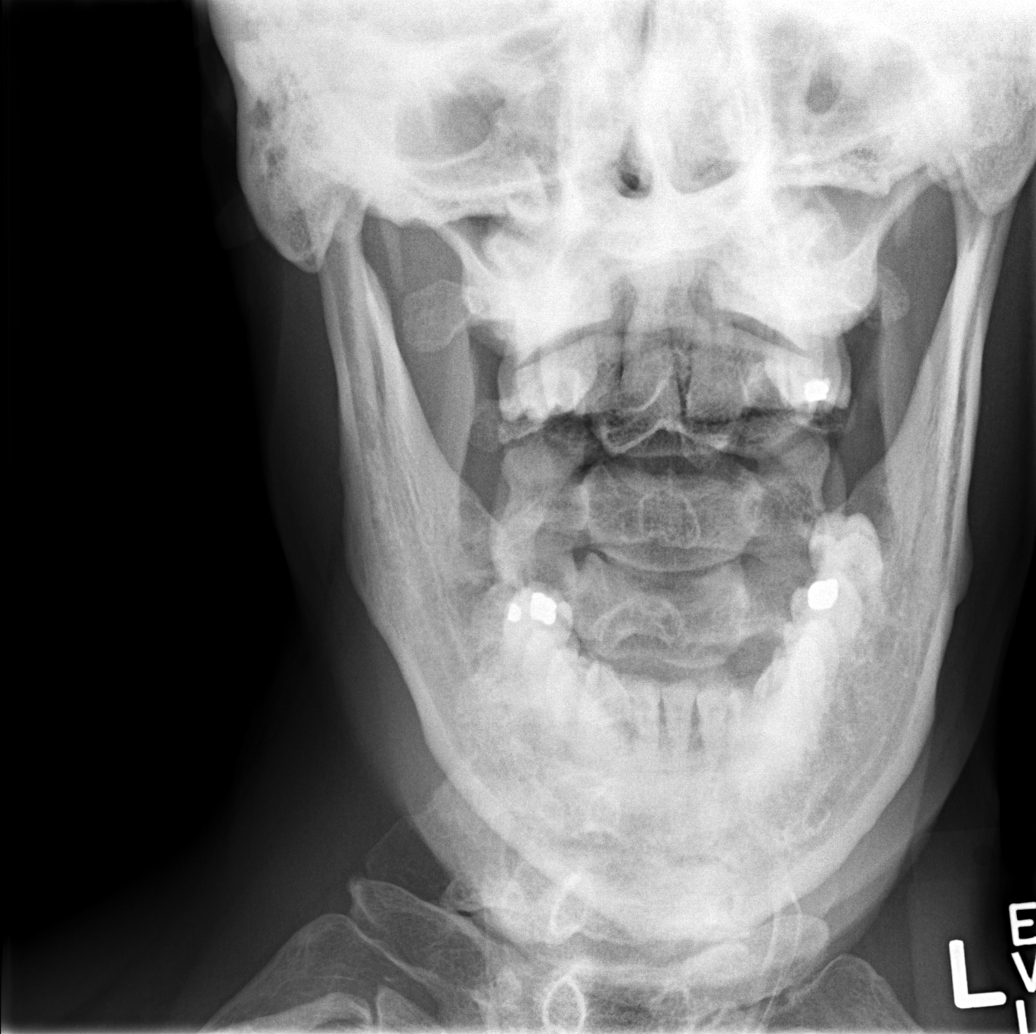

[6 of 6 positions shown; findings below may reference images not displayed]

FINDINGS: There is no evidence of cervical spine fracture or prevertebral soft
tissue swelling. No spondylolisthesis is noted. Moderate neural
foraminal stenosis is noted on the left at C6-7 secondary to
uncovertebral spurring. Posterior facet joints appear normal. Mild
degenerative disc disease is noted at C4-5 and C5-6.
IMPRESSION: Mild degenerative disc disease is noted at C4-5 and C5-6. Moderate
neural foraminal stenosis is noted on the left at C6-7 secondary to
uncovertebral spurring.

## 2018-01-11 ENCOUNTER — Other Ambulatory Visit: Payer: Self-pay

## 2018-01-11 ENCOUNTER — Encounter (HOSPITAL_COMMUNITY): Payer: Self-pay | Admitting: Emergency Medicine

## 2018-01-11 ENCOUNTER — Emergency Department (HOSPITAL_COMMUNITY)
Admission: EM | Admit: 2018-01-11 | Discharge: 2018-01-11 | Disposition: A | Discharge status: Home / Self Care | Payer: Self-pay | Attending: Emergency Medicine | Admitting: Emergency Medicine

## 2018-01-11 DIAGNOSIS — F17210 Nicotine dependence, cigarettes, uncomplicated: Secondary | ICD-10-CM | POA: Insufficient documentation

## 2018-01-11 DIAGNOSIS — R197 Diarrhea, unspecified: Secondary | ICD-10-CM | POA: Insufficient documentation

## 2018-01-11 DIAGNOSIS — R112 Nausea with vomiting, unspecified: Secondary | ICD-10-CM | POA: Insufficient documentation

## 2018-01-11 LAB — LIPASE, BLOOD: LIPASE: 32 U/L (ref 11–51)

## 2018-01-11 LAB — CBC
HEMATOCRIT: 44.5 % (ref 39.0–52.0)
Hemoglobin: 15.3 g/dL (ref 13.0–17.0)
MCH: 31.6 pg (ref 26.0–34.0)
MCHC: 34.4 g/dL (ref 30.0–36.0)
MCV: 91.9 fL (ref 78.0–100.0)
Platelets: 301 10*3/uL (ref 150–400)
RBC: 4.84 MIL/uL (ref 4.22–5.81)
RDW: 13.6 % (ref 11.5–15.5)
WBC: 5.1 10*3/uL (ref 4.0–10.5)

## 2018-01-11 LAB — URINALYSIS, ROUTINE W REFLEX MICROSCOPIC
BILIRUBIN URINE: NEGATIVE
GLUCOSE, UA: NEGATIVE mg/dL
HGB URINE DIPSTICK: NEGATIVE
KETONES UR: NEGATIVE mg/dL
LEUKOCYTES UA: NEGATIVE
Nitrite: NEGATIVE
PH: 6 (ref 5.0–8.0)
Protein, ur: NEGATIVE mg/dL
Specific Gravity, Urine: 1.019 (ref 1.005–1.030)

## 2018-01-11 LAB — COMPREHENSIVE METABOLIC PANEL
ALT: 10 U/L — ABNORMAL LOW (ref 17–63)
AST: 15 U/L (ref 15–41)
Albumin: 3.9 g/dL (ref 3.5–5.0)
Alkaline Phosphatase: 69 U/L (ref 38–126)
Anion gap: 5 (ref 5–15)
BUN: 14 mg/dL (ref 6–20)
CHLORIDE: 108 mmol/L (ref 101–111)
CO2: 28 mmol/L (ref 22–32)
Calcium: 9.2 mg/dL (ref 8.9–10.3)
Creatinine, Ser: 1.28 mg/dL — ABNORMAL HIGH (ref 0.61–1.24)
GFR calc Af Amer: >60 mL/min (ref >60)
GFR calc non Af Amer: >60 mL/min (ref >60)
Glucose, Bld: 78 mg/dL (ref 65–99)
POTASSIUM: 4.6 mmol/L (ref 3.5–5.1)
Sodium: 141 mmol/L (ref 135–145)
Total Bilirubin: 0.4 mg/dL (ref 0.3–1.2)
Total Protein: 6.9 g/dL (ref 6.5–8.1)

## 2018-01-11 MED ORDER — ONDANSETRON 4 MG PO TBDP
4.0000 mg | ORAL_TABLET | Freq: Once | ORAL | Status: AC
  Administered 2018-01-11: 4 mg via ORAL
  Filled 2018-01-11: qty 1

## 2018-01-11 NOTE — ED Triage Notes (Signed)
Pt complaint of n/v/d, cough, and generalized aches onset yesterday.

## 2018-01-11 NOTE — ED Notes (Signed)
No response when called to room from lobby.

## 2018-01-11 NOTE — ED Provider Notes (Signed)
[facility]-EMERGENCY DEPT Provider Note   CSN: [id number] Arrival date & time: 01/11/18  1521     History   Chief Complaint Chief Complaint  Patient presents with  . Flu Like Symptoms    HPI   Blood pressure 127/83, pulse 78, temperature 97.8 F (36.6 C), temperature source Oral, resp. rate 18, SpO2 100 %.  Clayton Howell is a 47 y.o. male complaining of nausea, vomiting, diarrhea starting yesterday.  He states he was waiting outside for the bus for an hour and had a little bit of runny nose but he denies fevers, chills, cough, shortness of breath, rhinorrhea, sore throat, myalgia, headache.  Patient states that he is now tolerating p.o.'s, he is drinking fluids and he ate [employer] while waiting in the waiting room.  Denies abdominal pain.   Past Medical History:  Diagnosis Date  . Crack cocaine use     Patient Active Problem List   Diagnosis Date Noted  . Cocaine dependence (HCC) 02/09/2014  . Alcohol abuse 02/09/2014  . Substance induced mood disorder (HCC) 03/16/2013    Past Surgical History:  Procedure Laterality Date  . LEG SURGERY         Home Medications    Prior to Admission medications   Medication Sig Start Date End Date Taking? Authorizing Provider  calcium carbonate (TUMS) 500 MG chewable tablet Chew 1 tablet by mouth 2 (two) times daily as needed for indigestion or heartburn.   Yes [provider]  FLUoxetine (PROZAC) 20 MG capsule Take 1 capsule (20 mg total) by mouth daily. For depression Patient not taking: Reported on 01/11/2018 02/13/14   Armandina StammerNwoko, Agnes I, NP  hydrOXYzine (ATARAX/VISTARIL) 25 MG tablet Take 1 tablet (25 mg) three times daily as needed: For anxiety Patient not taking: Reported on 01/11/2018 02/13/14   Armandina StammerNwoko, Agnes I, NP  lamoTRIgine (LAMICTAL) 25 MG tablet Take 1 tablet (25 mg total) by mouth daily. For mood stabilization Patient not taking: Reported on 01/11/2018 02/13/14   Armandina StammerNwoko, Agnes I, NP  traMADol (ULTRAM)  50 MG tablet Take 1 tablet (50 mg total) by mouth every 6 (six) hours as needed. Patient not taking: Reported on 01/11/2018 10/05/14   Lori Liew, Joni ReiningNicole, PA-C    Family History No family history on file.  Social History Social History   Tobacco Use  . Smoking status: Current Every Day Smoker    Types: Cigarettes  Substance Use Topics  . Alcohol use: No  . Drug use: Yes    Types: Cocaine     Allergies   Patient has no known allergies.   Review of Systems Review of Systems  A complete review of systems was obtained and all systems are negative except as noted in the HPI and PMH.    Physical Exam Updated Vital Signs BP 127/83 (BP Location: Right Arm)   Pulse 78   Temp 97.8 F (36.6 C) (Oral)   Resp 18   SpO2 100%   Physical Exam  Constitutional: He is oriented to person, place, and time. He appears well-developed and well-nourished. No distress.  HENT:  Head: Normocephalic and atraumatic.  Mouth/Throat: Oropharynx is clear and moist.  Eyes: Conjunctivae and EOM are normal. Pupils are equal, round, and reactive to light.  Neck: Normal range of motion.  Cardiovascular: Normal rate, regular rhythm and intact distal pulses.  Pulmonary/Chest: Effort normal and breath sounds normal. No stridor. No respiratory distress. He has no wheezes. He has no rales. He exhibits no tenderness.  Abdominal: Soft. He exhibits no distension and no mass. There is no tenderness. There is no rebound and no guarding. No hernia.  Musculoskeletal: Normal range of motion.  Neurological: He is alert and oriented to person, place, and time.  Skin: He is not diaphoretic.  Psychiatric: He has a normal mood and affect.  Nursing note and vitals reviewed.    ED Treatments / Results  Labs (all labs ordered are listed, but only abnormal results are displayed) Labs Reviewed  COMPREHENSIVE METABOLIC PANEL - Abnormal; Notable for the following components:      Result Value   Creatinine, Ser 1.28 (*)     ALT 10 (*)    All other components within normal limits  LIPASE, BLOOD  CBC  URINALYSIS, ROUTINE W REFLEX MICROSCOPIC    EKG  EKG Interpretation None       Radiology No results found.  Procedures Procedures (including critical care time)  Medications Ordered in ED Medications  ondansetron (ZOFRAN-ODT) disintegrating tablet 4 mg (4 mg Oral Given 01/11/18 1941)     Initial Impression / Assessment and Plan / ED Course  I have reviewed the triage vital signs and the nursing notes.  Pertinent labs & imaging results that were available during my care of the patient were reviewed by me and considered in my medical decision making (see chart for details).     Vitals:   01/11/18 1540 01/11/18 1915  BP: (!) 154/81 127/83  Pulse: 72 78  Resp: 18 18  Temp: 97.8 F (36.6 C)   TempSrc: Oral   SpO2: 95% 100%    Medications  ondansetron (ZOFRAN-ODT) disintegrating tablet 4 mg (4 mg Oral Given 01/11/18 1941)    Clayton Howell is 47 y.o. male presenting with nausea vomiting diarrhea, vomiting has resolved, mild elevation in creatinine, he is tolerating p.o.'s, abdominal exam is benign.  Work note provided.  Evaluation does not show pathology that would require ongoing emergent intervention or inpatient treatment. Pt is hemodynamically stable and mentating appropriately. Discussed findings and plan with patient/guardian, who agrees with care plan. All questions answered. Return precautions discussed and outpatient follow up given.      Final Clinical Impressions(s) / ED Diagnoses   Final diagnoses:  Nausea vomiting and diarrhea    ED Discharge Orders    None       Lynetta Mare Mardella Layman 01/11/18 1956    Shaune Pollack, MD 01/12/18 [phone]

## 2018-01-11 NOTE — Discharge Instructions (Signed)

## 2018-01-11 NOTE — ED Notes (Signed)
Pt aware that a urine sample is needed. Pt sts he is unable at this time.  Pt is in the waiting room so he was instructed to inform staff when he is able to provide a sample.

## 2024-01-16 ENCOUNTER — Emergency Department (HOSPITAL_COMMUNITY)
Admission: EM | Admit: 2024-01-16 | Discharge: 2024-01-16 | Disposition: A | Discharge status: Home / Self Care | Attending: Emergency Medicine | Admitting: Emergency Medicine

## 2024-01-16 ENCOUNTER — Emergency Department (HOSPITAL_COMMUNITY)

## 2024-01-16 DIAGNOSIS — Z79899 Other long term (current) drug therapy: Secondary | ICD-10-CM | POA: Insufficient documentation

## 2024-01-16 DIAGNOSIS — Z5900 Homelessness unspecified: Secondary | ICD-10-CM | POA: Insufficient documentation

## 2024-01-16 DIAGNOSIS — M25851 Other specified joint disorders, right hip: Secondary | ICD-10-CM | POA: Diagnosis not present

## 2024-01-16 DIAGNOSIS — M25551 Pain in right hip: Secondary | ICD-10-CM | POA: Insufficient documentation

## 2024-01-16 MED ORDER — NAPROXEN 500 MG PO TABS
500.0000 mg | ORAL_TABLET | Freq: Once | ORAL | Status: AC
  Administered 2024-01-16: 500 mg via ORAL
  Filled 2024-01-16: qty 1

## 2024-01-16 MED ORDER — MELOXICAM 7.5 MG PO TABS: 7.5000 mg | ORAL_TABLET | Freq: Every day | ORAL | 0 refills | Status: AC

## 2024-01-16 MED ORDER — LIDOCAINE 5 % EX PTCH
3.0000 | MEDICATED_PATCH | CUTANEOUS | Status: DC
  Administered 2024-01-16: 3 via TRANSDERMAL
  Filled 2024-01-16: qty 3

## 2024-01-16 NOTE — ED Triage Notes (Signed)
 X 2 months but worse last 2 days , denies injury that could of caused this , pain worse with bending, pt reports had a lidocaine patch today but when he took it off pain was worse. Pt ambulatory to ER, then put in wheelchair.

## 2024-01-16 NOTE — ED Provider Notes (Signed)
 [place] EMERGENCY DEPARTMENT AT [facility] Provider Note   CSN: [id number] Arrival date & time: 01/16/24  2011     History  Chief Complaint  Patient presents with   Hip Pain    Clayton Howell is a 53 y.o. male with PMHx of substance induced mood disorder, polysubstance abuse presents to ED for evaluation of right rip pain for the past two months that has worsened over the past two days. He cannot recall recent trauma to hip nor recent falls. He has been using lidocaine patch with helped with pain but pain worsened following patch removal. He denies swelling, warmth, erythema to hip, fevers, testicular complaints.   Hip Pain Pertinent negatives include no chest pain, no abdominal pain, no headaches and no shortness of breath.       Home Medications Prior to Admission medications   Medication Sig Start Date End Date Taking? Authorizing Provider  meloxicam (MOBIC) 7.5 MG tablet Take 1 tablet (7.5 mg total) by mouth daily. 01/16/24  Yes Judithann Sheen, PA  calcium carbonate (TUMS) 500 MG chewable tablet Chew 1 tablet by mouth 2 (two) times daily as needed for indigestion or heartburn.    [provider]  FLUoxetine (PROZAC) 20 MG capsule Take 1 capsule (20 mg total) by mouth daily. For depression Patient not taking: Reported on 01/11/2018 02/13/14   Armandina Stammer I, NP  hydrOXYzine (ATARAX/VISTARIL) 25 MG tablet Take 1 tablet (25 mg) three times daily as needed: For anxiety Patient not taking: Reported on 01/11/2018 02/13/14   Armandina Stammer I, NP  lamoTRIgine (LAMICTAL) 25 MG tablet Take 1 tablet (25 mg total) by mouth daily. For mood stabilization Patient not taking: Reported on 01/11/2018 02/13/14   Armandina Stammer I, NP  traMADol (ULTRAM) 50 MG tablet Take 1 tablet (50 mg total) by mouth every 6 (six) hours as needed. Patient not taking: Reported on 01/11/2018 10/05/14   Pisciotta, Joni Reining, PA-C      Allergies    Patient has no known allergies.    Review of Systems    Review of Systems  Constitutional:  Negative for chills, fatigue and fever.  Respiratory:  Negative for cough, chest tightness, shortness of breath and wheezing.   Cardiovascular:  Negative for chest pain and palpitations.  Gastrointestinal:  Negative for abdominal pain, constipation, diarrhea, nausea and vomiting.  Neurological:  Negative for dizziness, seizures, weakness, light-headedness, numbness and headaches.    Physical Exam Updated Vital Signs BP (!) 148/89 (BP Location: Right Arm)   Pulse 85   Temp 98.7 F (37.1 C) (Oral)   Resp 20   SpO2 99%  Physical Exam Vitals and nursing note reviewed.  Constitutional:      General: He is not in acute distress.    Comments: Disheveled with dirty clothes  HENT:     Head: Normocephalic and atraumatic.  Eyes:     Conjunctiva/sclera: Conjunctivae normal.  Cardiovascular:     Rate and Rhythm: Normal rate.     Pulses:          Posterior tibial pulses are 2+ on the right side and 2+ on the left side.  Pulmonary:     Effort: Pulmonary effort is normal. No respiratory distress.     Breath sounds: Normal breath sounds.  Musculoskeletal:     Right lower leg: No edema.     Left lower leg: No edema.     Comments: Able to fully flex and extend right hip passively. Pain with WB and  ambulation. No crepitus, grinding, obvious deformity. No warmth, swelling, erythema nor infectious appearing right hip.  Skin:    Capillary Refill: Capillary refill takes less than 2 seconds.     Coloration: Skin is not jaundiced or pale.  Neurological:     Mental Status: He is alert and oriented to person, place, and time. Mental status is at baseline.     Comments: Sensation 2/2 of dermatomes L3-S2 BLE. Motor 5/5 BLE     ED Results / Procedures / Treatments   Labs (all labs ordered are listed, but only abnormal results are displayed) Labs Reviewed - No data to display  EKG None  Radiology DG Hip Unilat W or Wo Pelvis 2-3 Views Right Result Date:  01/16/2024 CLINICAL DATA:  Tenderness to palpation of the right hip EXAM: DG HIP (WITH OR WITHOUT PELVIS) 2-3V RIGHT COMPARISON:  None Available. FINDINGS: There is decreased spherocity the right femoral head and irregularity of the superolateral aspect the right acetabulum which may together reflect changes of cam type hip impingement. Normal alignment. No acute fracture or dislocation. Sacroiliac and hip joint spaces are preserved. Soft tissues are unremarkable. IMPRESSION: 1. Morphologic changes which may together reflect changes of cam type hip impingement. This may be better assessed with dedicated examination. 2. No acute fracture or dislocation. Electronically Signed   By: Helyn Numbers M.D.   On: 01/16/2024 22:11    Procedures Procedures    Medications Ordered in ED Medications  lidocaine (LIDODERM) 5 % 3 patch (3 patches Transdermal Patch Applied 01/16/24 2342)  naproxen (NAPROSYN) tablet 500 mg (500 mg Oral Given 01/16/24 2207)    ED Course/ Medical Decision Making/ A&P                                 Medical Decision Making Amount and/or Complexity of Data Reviewed Radiology: ordered.  Risk Prescription drug management.   Patient presents to the ED for concern of right hip pain for 2 months, this involves an extensive number of treatment options, and is a complaint that carries with it a high risk of complications and morbidity.  The differential diagnosis includes bursitis, AVN, fracture, dislocation   Co morbidities that complicate the patient evaluation  See HPI   Additional history obtained:  Additional history obtained from Nursing   External records from outside source obtained and reviewed including triage RN note    Imaging Studies ordered:  I ordered imaging studies including pelvis and right hip XR   I independently visualized and interpreted imaging which showed concern for CAM type hip impingement I agree with the radiologist  interpretation    Medicines ordered and prescription drug management:  I ordered medication including naprosyn, lidocaine patches for pain  Reevaluation of the patient after these medicines showed that the patient improved I have reviewed the patients home medicines and have made adjustments as needed     Problem List / ED Course:  Right hip pain Noninfectious appearing hip. 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 Will provide NSAIDs, lido patch, crutches and ortho f/u Discussed symptomatic care with pt Discussed ED workup, disposition, return to ED precautions with patient who expresses understanding agrees with plan.  All questions answered to their satisfaction.  They are agreeable to plan.  Discharge instructions provided on paperwork  Reevaluation:  After the interventions noted above, I reevaluated the patient and found that they have :improved   Social Determinants of Health:  homeless   Dispostion:  After consideration of the diagnostic results and the patients response to treatment, I feel that the patent would benefit from outpatient management with ortho f/u.    Final Clinical Impression(s) / ED Diagnoses Final diagnoses:  Right hip pain    Rx / DC Orders ED Discharge Orders          Ordered    meloxicam (MOBIC) 7.5 MG tablet  Daily        01/16/24 2338              Judithann Sheen, PA 01/16/24 2348    Melene Plan, DO 01/17/24 0710

## 2024-01-16 NOTE — Discharge Instructions (Signed)
 Thank you for letting us evaluate you today.  Your x-ray showed mild hip impingement and this is secondary to a lot of physical activity.  I provided you with strong anti-inflammatory and lidocaine patch here in emergency department.  Ultimately, this is treated with anti-inflammatories, physical therapy, orthopedic follow-up, then surgery if nothing helps.  Please follow-up with orthopedics suggestion I provided
